# Patient Record
Sex: Female | Born: 1990 | Race: White | Hispanic: No | Marital: Married | Smoking: Never smoker
Health system: Southern US, Community
[De-identification: ages and names within clinical notes are randomized; demographics above are authoritative.]

## PROBLEM LIST (undated history)

## (undated) DIAGNOSIS — B001 Herpesviral vesicular dermatitis: Secondary | ICD-10-CM

## (undated) DIAGNOSIS — I10 Essential (primary) hypertension: Secondary | ICD-10-CM

## (undated) DIAGNOSIS — F419 Anxiety disorder, unspecified: Secondary | ICD-10-CM

## (undated) DIAGNOSIS — R519 Headache, unspecified: Secondary | ICD-10-CM

## (undated) DIAGNOSIS — R51 Headache: Secondary | ICD-10-CM

## (undated) HISTORY — PX: KELOID EXCISION: SHX1856

## (undated) HISTORY — DX: Headache, unspecified: R51.9

## (undated) HISTORY — DX: Herpesviral vesicular dermatitis: B00.1

## (undated) HISTORY — DX: Anxiety disorder, unspecified: F41.9

## (undated) HISTORY — PX: WISDOM TOOTH EXTRACTION: SHX21

## (undated) HISTORY — DX: Headache: R51

---
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
## 2011-02-02 ENCOUNTER — Encounter: Payer: Self-pay | Admitting: Women's Health

## 2011-02-02 ENCOUNTER — Other Ambulatory Visit (HOSPITAL_COMMUNITY)
Admission: RE | Admit: 2011-02-02 | Discharge: 2011-02-02 | Disposition: A | Discharge status: Home / Self Care | Payer: 59 | Source: Ambulatory Visit | Attending: Gynecology | Admitting: Gynecology

## 2011-02-02 ENCOUNTER — Ambulatory Visit (INDEPENDENT_AMBULATORY_CARE_PROVIDER_SITE_OTHER): Payer: 59 | Admitting: Women's Health

## 2011-02-02 VITALS — BP 130/84 | Ht 68.25 in | Wt 175.0 lb

## 2011-02-02 DIAGNOSIS — Z309 Encounter for contraceptive management, unspecified: Secondary | ICD-10-CM

## 2011-02-02 DIAGNOSIS — R82998 Other abnormal findings in urine: Secondary | ICD-10-CM

## 2011-02-02 DIAGNOSIS — Z01419 Encounter for gynecological examination (general) (routine) without abnormal findings: Secondary | ICD-10-CM

## 2011-02-02 DIAGNOSIS — Z23 Encounter for immunization: Secondary | ICD-10-CM

## 2011-02-02 DIAGNOSIS — Z113 Encounter for screening for infections with a predominantly sexual mode of transmission: Secondary | ICD-10-CM

## 2011-02-02 MED ORDER — VALACYCLOVIR HCL 500 MG PO TABS: 500.0000 mg | ORAL_TABLET | ORAL | Status: DC | PRN

## 2011-02-02 MED ORDER — NORETHIN-ETH ESTRAD-FE BIPHAS 1 MG-10 MCG / 10 MCG PO TABS: 1.0000 | ORAL_TABLET | Freq: Every day | ORAL | Status: DC

## 2011-02-02 NOTE — Progress Notes (Signed)
Tamara Spencer 1990/10/23 [id number]    History:    The patient presents for annual exam.  20 yo SWF G0 without complaint. Requesting contraception.Monthly, 6d cycle, currently using condoms.  Past medical history, past surgical history, family history and social history were all reviewed and documented in the EPIC chart.   ROS:  A 14 point ROS was performed and pertinent positives and negatives are included in the history.  Exam:  Filed Vitals:   02/02/11 1215  BP: 130/84    General appearance:  Normal Head/Neck:  Normal, without cervical or supraclavicular adenopathy. Thyroid:  Symmetrical, normal in size, without palpable masses or nodularity. Respiratory  Effort:  Normal  Auscultation:  Clear without wheezing or rhonchi Cardiovascular  Auscultation:  Regular rate, without rubs, murmurs or gallops  Edema/varicosities:  Not grossly evident Abdominal  Masses/tenderness:  Soft,nontender, without masses, guarding or rebound.  Liver/spleen:  No organomegaly noted  Hernia:  None appreciated  Occult test:   Skin  Inspection:  Grossly normal  Palpation:  Grossly normal Neurologic/psychiatric  Orientation:  Normal with appropriate conversation.  Mood/affect:  Normal  Genitourinary     Breasts: Examined lying and sitting.     Right: Without masses, retractions,discharge or axillary adenopathy.     Left: Without masses, retractions, discharge or axillary adenopathy.   Inguinal/mons:  Normal without inguinal adenopathy  External genitalia:  Normal  BUS/Urethra/Skene's glands:  Normal  Bladder:  Normal  Vagina:  Normal  Cervix:  Normal  Uterus:  , normal in size, shape and contour.  Midline and mobile  Adnexa/parametria:     Rt: Without masses or tenderness.   Lt: Without masses or tenderness.  Anus and perineum: Normal  Digital rectal exam: Normal sphincter tone without palpated masses or tenderness.   Assessment/Plan:  20 y.o. year oldSW female for annual exam.     she is without complaints today normal GYN exam. Of significance she does have a family history of a mother with diagnosed breast cancer at age 53.She  is estrogen receptive she is still living and doing well. She does have reservations about using birth control pills. But states did feel better when she was on. Reviewed slight risks of  Clots, strokes reviewed again the importance of SBEs, report any changes immediately.. Reviewed Mirena IUD. Handout was given and reviewed. Reviewed as slight risk of hemorrhage perforation infection was reviewed. Reviewed to have scheduled during her cycle with Dr. Audie Box. Prescription for low lo lo Estrin FE  was given. Start first day of her next cycle, encourage condoms especially first month. Will return to the office with her cycle to have IUD placed  if she so chooses. SBEs, exercise, multivitamin daily encouraged we'll check a GC and Chlamydia today UA and Pap only. She will have employment  labs drawn in the next few weeks she will have fax to our office. Declined HIV hepatitis and RPR. Gardasil information reviewed consent signed, first given today, will return in 2 and 6 months for second and third. Reviewed BRACA Testing due to family hx  Declines, states has discussed with mother.      Harrington Challenger MD, 2:18 PM 02/02/2011

## 2011-02-06 ENCOUNTER — Telehealth: Payer: Self-pay

## 2011-02-06 NOTE — Telephone Encounter (Signed)
YOU SAW PT. 02/02/11 FOR AEX. SHE WAS TO GET LABS @WORK  & HAVE THEM SENT TO YOU. THEY DID NOT DO ANY LABS. WANTS TO KNOW IF YOU NEED TO ORDER IN LABS BEFORE SHE STARTS HER OCP RX.

## 2011-02-07 ENCOUNTER — Telehealth: Payer: Self-pay | Admitting: Women's Health

## 2011-02-07 NOTE — Telephone Encounter (Signed)
NANCY DID YOU CALL PT? SHE IS STILL IN MY OPEN ENCOUNTERS FROM 02/06/11

## 2011-02-07 NOTE — Telephone Encounter (Signed)
Tc-reviewed labs for birth control pills. Will not get any labs at this time. Will have labs done at health screening at work.  Will return to office for second Gardasil.

## 2011-02-08 ENCOUNTER — Telehealth: Payer: Self-pay

## 2011-02-08 NOTE — Telephone Encounter (Signed)
ERROR NO NOTES

## 2011-02-08 NOTE — Telephone Encounter (Signed)
PER NANCY SHE SPOKE WITH PT AND ANSWERED QUESTION.

## 2011-03-13 ENCOUNTER — Ambulatory Visit: Payer: 59

## 2011-03-15 ENCOUNTER — Ambulatory Visit (INDEPENDENT_AMBULATORY_CARE_PROVIDER_SITE_OTHER): Payer: 59 | Admitting: *Deleted

## 2011-03-15 DIAGNOSIS — Z23 Encounter for immunization: Secondary | ICD-10-CM

## 2011-04-12 ENCOUNTER — Telehealth: Payer: Self-pay | Admitting: *Deleted

## 2011-04-12 NOTE — Telephone Encounter (Signed)
Left message on cell to take 2 lo Loestrin today and tomorrow. If bleeding would continue to call back, did review it sometimes takes 2 or 3 months for cycle to regulate when starting on pills.

## 2011-04-12 NOTE — Telephone Encounter (Signed)
Patient called c/o problem with oc's.  Did good with the first month pack, but started the second month pack, period started a week early.  Been going on for 9 days now.  Started new pack and still bleeding.  Not sure what to do. (was seen recently in epic chart)

## 2011-07-06 ENCOUNTER — Ambulatory Visit (INDEPENDENT_AMBULATORY_CARE_PROVIDER_SITE_OTHER): Payer: 59

## 2011-07-06 DIAGNOSIS — Z23 Encounter for immunization: Secondary | ICD-10-CM

## 2011-09-27 ENCOUNTER — Ambulatory Visit (INDEPENDENT_AMBULATORY_CARE_PROVIDER_SITE_OTHER): Payer: 59 | Admitting: Women's Health

## 2011-09-27 ENCOUNTER — Encounter: Payer: Self-pay | Admitting: Women's Health

## 2011-09-27 DIAGNOSIS — IMO0001 Reserved for inherently not codable concepts without codable children: Secondary | ICD-10-CM

## 2011-09-27 DIAGNOSIS — Z309 Encounter for contraceptive management, unspecified: Secondary | ICD-10-CM

## 2011-09-27 MED ORDER — NORGESTIMATE-ETH ESTRADIOL 0.25-35 MG-MCG PO TABS: 1.0000 | ORAL_TABLET | Freq: Every day | ORAL | Status: DC

## 2011-09-27 NOTE — Progress Notes (Signed)
Patient ID: Deltha Bernales, female   DOB: 1990-10-06, 21 y.o.   MRN: [id number] Presents with the complaint of breakthrough bleeding on lo loestrin FE. Has been on since August 2012. First 2 months minimal breakthrough bleeding, BTB  has increased each month. Same partner with negative cultures. Denies missed or late pills.  Had requested the lowest available estrogen product due to history of mother with breast cancer. Again reviewed no research linking birth control pills with breast cancer.  Breakthrough bleeding on lo Loestrin Fe  Plan: Options reviewed, IUD, progestin only pill, would like to try a different pill. Prescription, proper use for Ortho-Cyclen, will finish out current pack and then start. Instructed to call if  bleeding continues after second month. Reviewed slight risk for blood clots and strokes with combination birth control pills.

## 2011-10-01 ENCOUNTER — Institutional Professional Consult (permissible substitution): Payer: 59 | Admitting: Women's Health

## 2011-10-25 ENCOUNTER — Ambulatory Visit (INDEPENDENT_AMBULATORY_CARE_PROVIDER_SITE_OTHER): Payer: 59 | Admitting: Family Medicine

## 2011-10-25 ENCOUNTER — Encounter: Payer: Self-pay | Admitting: Family Medicine

## 2011-10-25 VITALS — BP 124/80 | HR 72 | Ht 68.0 in | Wt 175.0 lb

## 2011-10-25 DIAGNOSIS — G43909 Migraine, unspecified, not intractable, without status migrainosus: Secondary | ICD-10-CM | POA: Insufficient documentation

## 2011-10-25 DIAGNOSIS — Z0000 Encounter for general adult medical examination without abnormal findings: Secondary | ICD-10-CM

## 2011-10-25 LAB — POCT URINALYSIS DIPSTICK
Bilirubin, UA: NEGATIVE
Nitrite, UA: NEGATIVE
pH, UA: 7

## 2011-10-25 MED ORDER — ELETRIPTAN HYDROBROMIDE 40 MG PO TABS: 40.0000 mg | ORAL_TABLET | ORAL | Status: DC | PRN

## 2011-10-25 NOTE — Progress Notes (Signed)
Tamara Spencer is a 21 y.o. female who presents for a complete physical.  She has the following concerns:  New patient annual CPE, sees Maryelizabeth Rowan, NP at [facility]. Last pap 01/2011. States that she is having some HA's due to BCP's and is working with her gynecologist but did want to mention to you. UA 2+ blood, patient currently on menses.  Headaches can be triggered by skipping meals, certain fluorescent lights.  Headaches are usually in the afternoon.  The only med that helps is Excedrin Migraine, or going to sleep resolves the headaches.  Gets headaches 3-4x/week.  Perhaps slighter better since getting new contacts.  Never gets any visual changes, flashing lights, field cuts. Occasionally gets nauseated with headaches. +photo and phonophobia with headaches.  Headaches are more prominent at L forehead and L side of head, although headache is more diffuse.  Throbbing, constant. Denies sinus problems.  Having these headaches for several years (even prior to OCP's).  Headaches got worse with birth control (low dose estrogen), and even worse this month with change in OCP/higher estrogen.  Health Maintenance:  Immunization History  Administered Date(s) Administered  . HPV Quadrivalent 02/02/2011, 03/15/2011, 07/06/2011  . Td 07/09/2002  . Tdap 04/11/2009  gets flu shots yearly Last Pap smear: 01/2011 Last mammogram: never Last colonoscopy: never Last DEXA: never Dentist: twice yearly Ophtho: a few weeks ago, wears contacts Exercise: once a week Lipids--done 04/2009: total 148, LDL 89, HDL 47, TG 61 Had normal TSH, and low normal B12 (290) also in 04/2009.  Vitamin D was 28.3.  Previously took OTC Vitamin D, not taking now, other than MVI Chem panel 11/2009 normal except nonfasting glu 101  ROS:  The patient denies anorexia, fever, weight changes,  vision changes, decreased hearing, ear pain, sore throat, breast concerns, chest pain, palpitations, dizziness, syncope, dyspnea on exertion,  cough, swelling, nausea, vomiting, diarrhea, constipation, abdominal pain, melena, hematochezia, indigestion/heartburn, hematuria, incontinence, dysuria, vaginal discharge, odor or itch, genital lesions, joint pains, numbness, tingling, weakness, tremor, suspicious skin lesions, depression, anxiety, abnormal bleeding/bruising (always bruises easily), or enlarged lymph nodes.  Periods were irregular on lo-loestrin FE (bleeding/spotting frequently), so recently changed OCP's to higher estrogen.  No abnormal bleeding this month, but having more headaches.  PHYSICAL EXAM: BP 124/80  Pulse 72  Ht 5\' 8"  (1.727 m)  Wt 175 lb (79.379 kg)  BMI 26.61 kg/m2  LMP 10/22/2011 124/80 on repeat by MD General Appearance:    Alert, cooperative, no distress, appears stated age  Head:    Normocephalic, without obvious abnormality, atraumatic  Eyes:    PERRL, conjunctiva/corneas clear, EOM's intact, fundi    benign  Ears:    Normal TM's and external ear canals  Nose:   Nares normal, mucosa normal, no drainage or sinus   tenderness  Throat:   Lips, mucosa, and tongue normal; teeth and gums normal  Neck:   Supple, no lymphadenopathy;  thyroid:  no   enlargement/tenderness/nodules; no carotid   bruit or JVD  Back:    Spine nontender, no curvature, ROM normal, no CVA     tenderness  Lungs:     Clear to auscultation bilaterally without wheezes, rales or     ronchi; respirations unlabored  Chest Wall:    No tenderness or deformity   Heart:    Regular rate and rhythm, S1 and S2 normal, no murmur, rub   or gallop  Breast Exam:   deferred to GYN  Abdomen:     Soft, non-tender,  nondistended, normoactive bowel sounds,    no masses, no hepatosplenomegaly  Genitalia:   deferred to GYN  Rectal:    Not performed due to age<40 and no related complaints  Extremities:   No clubbing, cyanosis or edema  Pulses:   2+ and symmetric all extremities  Skin:   Skin color, texture, turgor normal, no rashes or lesions  Lymph  nodes:   Cervical, supraclavicular, and axillary nodes normal  Neurologic:   CNII-XII intact, normal strength, sensation and gait; reflexes 2+ and symmetric throughout          Psych:   Normal mood, affect, hygiene and grooming.    ASSESSMENT/PLAN 1. Routine general medical examination at a health care facility  POCT Urinalysis Dipstick, Visual acuity screening  2. Migraine headache  eletriptan (RELPAX) 40 MG tablet   Headaches--sound like migraines.  Reviewed possible triggers; keep headache log.  To discuss with GYN whether or not she wants to consider other forms of contraception.  If ongoing headaches, despite trying to avoid triggers, return in 1-2 months to discuss preventative meds.  Sample (and copay card) for Relpax given. Discussed potential risks/side effects, and to not use too frequently or will lose efficacy. Discussed using OTC migraine med vs NSAID + caffeine at early onset of headache, then proceeding to triptan in 1-2 hours if not better.  Declines vitamin D testing.  Not fasting today.  No other labs needed. Encouraged 1000 IU daily.  Discussed monthly self breast exams and breast cancer screening at age 66 given family history; at least 30 minutes of aerobic activity at least 5 days/week; proper sunscreen use reviewed; healthy diet, including goals of calcium and vitamin D intake and alcohol recommendations (less than or equal to 1 drink/day) reviewed; regular seatbelt use; changing batteries in smoke detectors.  Immunization recommendations discussed--UTD.

## 2011-10-25 NOTE — Patient Instructions (Addendum)
HEALTH MAINTENANCE RECOMMENDATIONS:  It is recommended that you get at least 30 minutes of aerobic exercise at least 5 days/week (for weight loss, you may need as much as 60-90 minutes). This can be any activity that gets your heart rate up. This can be divided in 10-15 minute intervals if needed, but try and build up your endurance at least once a week.  Weight bearing exercise is also recommended twice weekly.  Eat a healthy diet with lots of vegetables, fruits and fiber.  "Colorful" foods have a lot of vitamins (ie green vegetables, tomatoes, red peppers, etc).  Limit sweet tea, regular sodas and alcoholic beverages, all of which has a lot of calories and sugar.  Up to 1 alcoholic drink daily may be beneficial for women (unless trying to lose weight, watch sugars).  Drink a lot of water.  Calcium recommendations are 1200-1500 mg daily (1500 mg for postmenopausal women or women without ovaries), and vitamin D 1000 IU daily.  This should be obtained from diet and/or supplements (vitamins), and calcium should not be taken all at once, but in divided doses.  Monthly self breast exams and yearly mammograms for women over the age of 27 is recommended.  Sunscreen of at least SPF 30 should be used on all sun-exposed parts of the skin when outside between the hours of 10 am and 4 pm (not just when at beach or pool, but even with exercise, golf, tennis, and yard work!)  Use a sunscreen that says "broad spectrum" so it covers both UVA and UVB rays, and make sure to reapply every 1-2 hours.  Remember to change the batteries in your smoke detectors when changing your clock times in the spring and fall.  Use your seat belt every time you are in a car, and please drive safely and not be distracted with cell phones and texting while driving.   Migraine Headache A migraine headache is an intense, throbbing pain on one or both sides of your head. The exact cause of a migraine headache is not always known. A  migraine may be caused when nerves in the brain become irritated and release chemicals that cause swelling within blood vessels, causing pain. Many migraine sufferers have a family history of migraines. Before you get a migraine you may or may not get an aura. An aura is a group of symptoms that can predict the beginning of a migraine. An aura may include:  Visual changes such as:   Flashing lights.   Bright spots or zig-zag lines.   Tunnel vision.   Feelings of numbness.   Trouble talking.   Muscle weakness.  SYMPTOMS  Pain on one or both sides of your head.   Pain that is pulsating or throbbing in nature.   Pain that is severe enough to prevent daily activities.   Pain that is aggravated by any daily physical activity.   Nausea (feeling sick to your stomach), vomiting, or both.   Pain with exposure to bright lights, loud noises, or activity.   General sensitivity to bright lights or loud noises.  MIGRAINE TRIGGERS Examples of triggers of migraine headaches include:   Alcohol.   Smoking.   Stress.   It may be related to menses (female menstruation).   Aged cheeses.   Foods or drinks that contain nitrates, glutamate, aspartame, or tyramine.   Lack of sleep.   Chocolate.   Caffeine.   Hunger.   Medications such as nitroglycerine (used to treat chest pain), birth control  pills, estrogen, and some blood pressure medications.  DIAGNOSIS  A migraine headache is often diagnosed based on:  Symptoms.   Physical examination.   A computerized X-ray scan (computed tomography, CT) of your head.  TREATMENT  Medications can help prevent migraines if they are recurrent or should they become recurrent. Your caregiver can help you with a medication or treatment program that will be helpful to you.   Lying down in a dark, quiet room may be helpful.   Keeping a headache diary may help you find a trend as to what may be triggering your headaches.  SEEK IMMEDIATE  MEDICAL CARE IF:   You have confusion, personality changes or seizures.   You have headaches that wake you from sleep.   You have an increased frequency in your headaches.   You have a stiff neck.   You have a loss of vision.   You have muscle weakness.   You start losing your balance or have trouble walking.   You feel faint or pass out.  MAKE SURE YOU:   Understand these instructions.   Will watch your condition.   Will get help right away if you are not doing well or get worse.  Document Released: 06/25/2005 Document Revised: 06/14/2011 Document Reviewed: 02/08/2009 [facility] Patient Information 2012 [place], [place].  Try and keep a headache journal as we discussed (writing down frequency and severity, possible triggers, what you took and whether or not it worked).  At early onset of headache, try 600-800mg  of ibuprofen or 2 Aleve, along with caffeine, vs an OTC "migraine" medication.   If headache persists (or is worse), then try the Relpax. You can try 1/2 to 1 tablet, and if no better, may repeat in 2 hours.  Maximum of 80mg  in 24 hours.  You shouldn't take this too frequently, or else it will stop working.  Return in 1 month to discuss headaches, if still occuring 3-4 times weekly.

## 2011-11-09 ENCOUNTER — Encounter (HOSPITAL_COMMUNITY): Payer: Self-pay | Admitting: Psychiatry

## 2011-11-09 ENCOUNTER — Ambulatory Visit (INDEPENDENT_AMBULATORY_CARE_PROVIDER_SITE_OTHER): Payer: 59 | Admitting: Psychiatry

## 2011-11-09 VITALS — BP 150/80 | HR 60 | Resp 12

## 2011-11-09 DIAGNOSIS — F4323 Adjustment disorder with mixed anxiety and depressed mood: Secondary | ICD-10-CM

## 2011-11-09 MED ORDER — CLORAZEPATE DIPOTASSIUM 3.75 MG PO TABS: ORAL_TABLET | ORAL | Status: DC

## 2011-11-09 NOTE — Progress Notes (Deleted)
Psychiatric Assessment Adult  Patient Identification:  Tamara Spencer Date of Evaluation:  11/09/2011 Chief Complaint: *** History of Chief Complaint:   Chief Complaint  Patient presents with  . Anxiety    HPI Review of Systems Physical Exam  Depressive Symptoms: {DEPRESSION SYMPTOMS:20000}  (Hypo) Manic Symptoms:   Elevated Mood:  {BHH YES OR NO:22294} Irritable Mood:  {BHH YES OR NO:22294} Grandiosity:  {BHH YES OR NO:22294} Distractibility:  {BHH YES OR NO:22294} Labiality of Mood:  {BHH YES OR NO:22294} Delusions:  {BHH YES OR NO:22294} Hallucinations:  {BHH YES OR NO:22294} Impulsivity:  {BHH YES OR NO:22294} Sexually Inappropriate Behavior:  {BHH YES OR NO:22294} Financial Extravagance:  {BHH YES OR NO:22294} Flight of Ideas:  {BHH YES OR NO:22294}  Anxiety Symptoms: Excessive Worry:  {BHH YES OR NO:22294} Panic Symptoms:  {BHH YES OR NO:22294} Agoraphobia:  {BHH YES OR NO:22294} Obsessive Compulsive: {BHH YES OR NO:22294}  Symptoms: {Obsessive Compulsive Symptoms:22671} Specific Phobias:  {BHH YES OR NO:22294} Social Anxiety:  {BHH YES OR NO:22294}  Psychotic Symptoms:  Hallucinations: {BHH YES OR NO:22294} {Hallucinations:22672} Delusions:  {BHH YES OR NO:22294} Paranoia:  {BHH YES OR NO:22294}   Ideas of Reference:  {BHH YES OR NO:22294}  PTSD Symptoms: Ever had a traumatic exposure:  {BHH YES OR NO:22294} Had a traumatic exposure in the last month:  {BHH YES OR NO:22294} Re-experiencing: {BHH YES OR NO:22294} {Re-experiencing:22673} Hypervigilance:  {BHH YES OR NO:22294} Hyperarousal: {BHH YES OR NO:22294} {Hyperarousal:22674} Avoidance: {BHH YES OR NO:22294} {Avoidance:22675}  Traumatic Brain Injury: {BHH YES OR NO:22294} {Traumatic Brain Injury:22676}  Past Psychiatric History: Diagnosis: ***  Hospitalizations: ***  Outpatient Care: ***  Substance Abuse Care: ***  Self-Mutilation: ***  Suicidal Attempts: ***  Violent Behaviors: ***   Past  Medical History:   Past Medical History  Diagnosis Date  . Recurrent cold sores   . Menorrhagia    History of Loss of Consciousness:  {BHH YES OR NO:22294} Seizure History:  {BHH YES OR NO:22294} Cardiac History:  {BHH YES OR NO:22294} Allergies:  No Known Allergies Current Medications:  Current Outpatient Prescriptions  Medication Sig Dispense Refill  . eletriptan (RELPAX) 40 MG tablet One tablet by mouth at onset of headache. May repeat in 2 hours if headache persists or recurs. may repeat in 2 hours if necessary  2 tablet  0  . Multiple Vitamins-Minerals (MULTIVITAMIN WITH MINERALS) tablet Take 1 tablet by mouth daily.      . norgestimate-ethinyl estradiol (SPRINTEC 28) 0.25-35 MG-MCG tablet Take 1 tablet by mouth daily.  3 Package  3  . valACYclovir (VALTREX) 500 MG tablet Take 1 tablet (500 mg total) by mouth as needed.  30 tablet  12    Previous Psychotropic Medications:  Medication Dose   ***  ***                     Substance Abuse History in the last 12 months: Substance Age of 1st Use Last Use Amount Specific Type  Nicotine  ***  ***  ***  ***  Alcohol  ***  ***  ***  ***  Cannabis  ***  ***  ***  ***  Opiates  ***  ***  ***  ***  Cocaine  ***  ***  ***  ***  Methamphetamines  ***  ***  ***  ***  LSD  ***  ***  ***  ***  Ecstasy  ***   ***  ***  ***  Benzodiazepines  ***  ***  ***  ***  Caffeine  ***  ***  ***  ***  Inhalants  ***  ***  ***  ***  Others:                          Medical Consequences of Substance Abuse: ***  Legal Consequences of Substance Abuse: ***  Family Consequences of Substance Abuse: ***  Blackouts:  {BHH YES OR NO:22294} DT's:  {BHH YES OR NO:22294} Withdrawal Symptoms:  {BHH YES OR NO:22294} {Withdrawal Symptoms:22677}  Social History: Current Place of Residence: *** Place of Birth: *** Family Members: *** Marital Status:  {Marital Status:22678} Children: ***  Sons: ***  Daughters: *** Relationships: *** Education:   {Education:22679} Educational Problems/Performance: *** Religious Beliefs/Practices: *** History of Abuse: {Desc; abuse:16542} Occupational Experiences; Military History:  {Military History:22680} Legal History: *** Hobbies/Interests: ***  Family History:   Family History  Problem Relation Age of Onset  . Diabetes Father   . Hypertension Father   . Cancer Maternal Aunt     great aunt --colon (late 13's)  . Diabetes Paternal Grandfather   . Cancer Mother 59    estrogen receptor + breast cancer  . Cancer Maternal Aunt     great aunt--breast cancer (40's)    Mental Status Examination/Evaluation: Objective:  Appearance: {Appearance:22683}  Eye Contact::  {BHH EYE CONTACT:22684}  Speech:  {Speech:22685}  Volume:  {Volume (PAA):22686}  Mood:  ***  Affect:  {Affect (PAA):22687}  Thought Process:  {Thought Process (PAA):22688}  Orientation:  {BHH ORIENTATION (PAA):22689}  Thought Content:  {Thought Content:22690}  Suicidal Thoughts:  {ST/HT (PAA):22692}  Homicidal Thoughts:  {ST/HT (PAA):22692}  Judgement:  {Judgement (PAA):22694}  Insight:  {Insight (PAA):22695}  Psychomotor Activity:  {Psychomotor (PAA):22696}  Akathisia:  {BHH YES OR NO:22294}  Handed:  {Handed:22697}  AIMS (if indicated):  ***  Assets:  {Assets (PAA):22698}    Laboratory/X-Ray Psychological Evaluation(s)   ***  ***   Assessment:  {axis diagnosis:3049000}  AXIS I {psych axis 1:31909}  AXIS II {psych axis 2:31910}  AXIS III Past Medical History  Diagnosis Date  . Recurrent cold sores   . Menorrhagia      AXIS IV {psych axis iv:31915}  AXIS V {psych axis v score:31919}   Treatment Plan/Recommendations:  Plan of Care: ***  Laboratory:  {Laboratory:22682}  Psychotherapy: ***  Medications: ***  Routine PRN Medications:  {BHH YES OR NO:22294}  Consultations: ***  Safety Concerns:  ***  Other:      Lucas Mallow, MD 5/3/201311:02 AM
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
## 2011-11-12 ENCOUNTER — Telehealth: Payer: Self-pay | Admitting: *Deleted

## 2011-11-12 NOTE — Telephone Encounter (Signed)
Patient's boyfriend called because he was concerned that pt had "passed out" 4 times in the last 30 minutes. When he wakes her up she seems really out of it. What should he do?

## 2011-11-12 NOTE — Telephone Encounter (Signed)
If she is having recurrent syncopal episodes, she needs to be evaluated in the ER

## 2011-11-12 NOTE — Telephone Encounter (Signed)
Spoke with Tammy Sours and let him know that Dr.Knapp advised that she be seen at the ER.

## 2011-11-15 ENCOUNTER — Encounter (HOSPITAL_COMMUNITY): Payer: Self-pay | Admitting: Psychology

## 2011-11-15 ENCOUNTER — Ambulatory Visit (INDEPENDENT_AMBULATORY_CARE_PROVIDER_SITE_OTHER): Payer: 59 | Admitting: Psychology

## 2011-11-15 DIAGNOSIS — F419 Anxiety disorder, unspecified: Secondary | ICD-10-CM

## 2011-11-15 DIAGNOSIS — F329 Major depressive disorder, single episode, unspecified: Secondary | ICD-10-CM

## 2011-11-15 DIAGNOSIS — F341 Dysthymic disorder: Secondary | ICD-10-CM

## 2011-11-15 DIAGNOSIS — F4323 Adjustment disorder with mixed anxiety and depressed mood: Secondary | ICD-10-CM

## 2011-11-15 NOTE — Progress Notes (Signed)
   THERAPIST PROGRESS NOTE  Session Time: 1030 - 1115  Participation Level: Active  Behavioral Response: Neat and Well GroomedAlertAnxious  Type of Therapy: Individual Therapy  Treatment Goals addressed: Anxiety and Coping  Interventions: Psychosocial Skills: variety of ways to deal with anxiety  Summary: Tamara Spencer is a 21 y.o. female who presents with complaint of anxiety that is leading her to think about self-mutilation, something she practiced as a teen and does not want to do again. "It was so hard to stop")  We reviewed the main elements of her recent history and the concerns she is currently having regarding relationship.  We started identifying thought that trigger the impulse to cut:  When her mother is so tired and cries about the fall-off of income in the family business; when her father shares his concerns about the business' or when she sees her current female friend talking to another woman. Significant history:  She dated only one boy from age 79 until 3.  At age 22, he had a ring and asked her to marry him, but wanted to ask her father first, and never quite got around to doing that.  Later he said, he wasn't "ready yet for marriage".  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  After she broke up with him, he and his brother left the business, and the replacements her father has hired are not as dedicated or productive, so the business is in trouble and her father and mother are both having to put in more hours themselves.  Because of the isolation with the boyfriend, she did not develop her own friends or interests, and so has very little to fall back on now.  We talked about the goals for therapy, to begin to find out who Austen is as a person in her own right.  We talked about the value of exercise in the treatment of anxiety and depression and brainstormed about other activities she might try to see what it is she likes. She was most  receptive and able to see the value to exploring to learn more about herself.  Suicidal/Homicidal: Nowithout intent/plan  Therapist Response: Helped her outline some goals for therapy and a plan for sessions every 2 weeks for the next three months.  Did inform her of my pending retirement, and assured her of continuation with another therapist , if she still needs this then.  Plan: Return again in 2 weeks.  In the meantime she has agreed to : 1. Start a journal to write details of occasions when she feels like cutting and all that was going on before she started feeling that way. 2. Offer to share some household expenses at her parent's home, because she is now an adult and needs to learn about being more independent financially. 3. Investigate training opportunities for her "mean" dog so that he will be a better companion. 4. Talk to more people at work and find out about their hobbies and interests.  Try out some leisure activities to start to learn what she likes. 5. Use her piano skills as something active to do with her hands when she feels like cutting.  Play to match her mood or to uplift herself.   Diagnosis: Axis I: Adjustment Disorder with Mixed Emotional Features    Axis II: Deferred    Rayvion Stumph, RN 11/15/2011

## 2011-12-04 ENCOUNTER — Ambulatory Visit (HOSPITAL_COMMUNITY): Payer: Self-pay | Admitting: Psychology

## 2011-12-19 ENCOUNTER — Ambulatory Visit (HOSPITAL_COMMUNITY): Payer: Self-pay | Admitting: Psychiatry

## 2012-02-08 ENCOUNTER — Ambulatory Visit (INDEPENDENT_AMBULATORY_CARE_PROVIDER_SITE_OTHER): Payer: 59 | Admitting: Medical

## 2012-02-08 ENCOUNTER — Encounter: Payer: Self-pay | Admitting: Medical

## 2012-02-08 VITALS — BP 128/80 | HR 64 | Temp 98.3°F | Resp 16 | Wt 178.0 lb

## 2012-02-08 DIAGNOSIS — H00019 Hordeolum externum unspecified eye, unspecified eyelid: Secondary | ICD-10-CM

## 2012-02-08 DIAGNOSIS — W57XXXA Bitten or stung by nonvenomous insect and other nonvenomous arthropods, initial encounter: Secondary | ICD-10-CM

## 2012-02-08 DIAGNOSIS — R110 Nausea: Secondary | ICD-10-CM

## 2012-02-08 DIAGNOSIS — T148XXA Other injury of unspecified body region, initial encounter: Secondary | ICD-10-CM

## 2012-02-08 DIAGNOSIS — R509 Fever, unspecified: Secondary | ICD-10-CM

## 2012-02-08 DIAGNOSIS — T148 Other injury of unspecified body region: Secondary | ICD-10-CM

## 2012-02-08 MED ORDER — DOXYCYCLINE HYCLATE 100 MG PO TABS: 100.0000 mg | ORAL_TABLET | Freq: Two times a day (BID) | ORAL | Status: AC

## 2012-02-08 NOTE — Progress Notes (Signed)
  Subjective:   HPI  Tamara Spencer is a 21 y.o. female who presents with few day hx/o nausea, fatigue and insect bite.  Otherwise no symptoms.  Was out in the woods hiking and fishing over the past weekend, got bitten on legs and abdomen by bugs, not sure what bugs though. She did have ticks on her but none adhered or bit in.  No other aggravating or relieving factors.  She also notes small stye of left eye , improving some with OTC eye drops.    No other c/o.  The following portions of the patient's history were reviewed and updated as appropriate: allergies, current medications, past family history, past medical history, past social history, past surgical history and problem list.  Past Medical History  Diagnosis Date  . Recurrent cold sores   . Menorrhagia   . History of eating disorder   . Anxiety     No Known Allergies   Review of Systems ROS reviewed and was negative other than noted in HPI or above.    Objective:   Physical Exam  General appearance: alert, no distress, WD/WN Skin: left anterior lower leg just distal to the knee with 3cm x 2cm patch of raised erythema and central marking suggestive of insect bite, similar but slightly larger erythematous lesion behind left knee, and similar sized and character lesion on right lower abdomen, all suggestive of insect bite such as chigger or tick bites HEENT: left lower eyelid medially with round swelling c/w stye, normocephalic, sclerae anicteric, TMs pearly, nares patent, no discharge or erythema, pharynx normal Oral cavity: MMM, no lesions Neck: supple, no lymphadenopathy, no thyromegaly, no masses Heart: RRR, normal S1, S2, no murmurs Lungs: CTA bilaterally, no wheezes, rhonchi, or rales Pulses: 2+ symmetric   Assessment and Plan :     Encounter Diagnoses  Name Primary?  . Insect bite Yes  . Nausea   . Fever   . Stye    Advised rest, hydrate well, begin Doxycycline for possible insect or tick born illness, and  can use Cortaid OTC topically on the bites.  Call/return if worse or not improving, discussed caution in the sun with Doxycyline.  Stye - warm compresses, and if not improving or worse in the next 5-7 days, then call.

## 2012-04-04 ENCOUNTER — Other Ambulatory Visit: Payer: Self-pay | Admitting: Women's Health

## 2012-04-04 MED ORDER — VALACYCLOVIR HCL 500 MG PO TABS: 500.0000 mg | ORAL_TABLET | ORAL | Status: DC | PRN

## 2012-04-09 ENCOUNTER — Encounter: Payer: 59 | Admitting: Women's Health

## 2012-04-25 ENCOUNTER — Encounter: Payer: Self-pay | Admitting: Gynecology

## 2012-04-25 ENCOUNTER — Ambulatory Visit (INDEPENDENT_AMBULATORY_CARE_PROVIDER_SITE_OTHER): Payer: 59 | Admitting: Gynecology

## 2012-04-25 VITALS — BP 116/74 | Ht 69.0 in | Wt 183.0 lb

## 2012-04-25 DIAGNOSIS — Z01419 Encounter for gynecological examination (general) (routine) without abnormal findings: Secondary | ICD-10-CM

## 2012-04-25 DIAGNOSIS — IMO0001 Reserved for inherently not codable concepts without codable children: Secondary | ICD-10-CM

## 2012-04-25 DIAGNOSIS — L293 Anogenital pruritus, unspecified: Secondary | ICD-10-CM

## 2012-04-25 DIAGNOSIS — N898 Other specified noninflammatory disorders of vagina: Secondary | ICD-10-CM

## 2012-04-25 DIAGNOSIS — Z309 Encounter for contraceptive management, unspecified: Secondary | ICD-10-CM

## 2012-04-25 LAB — CBC WITH DIFFERENTIAL/PLATELET
Basophils Relative: 0 % (ref 0–1)
Eosinophils Absolute: 0 10*3/uL (ref 0.0–0.7)
Eosinophils Relative: 0 % (ref 0–5)
HCT: 38.4 % (ref 36.0–46.0)
Hemoglobin: 13.1 g/dL (ref 12.0–15.0)
MCH: 27.8 pg (ref 26.0–34.0)
MCHC: 34.1 g/dL (ref 30.0–36.0)
Monocytes Absolute: 0.5 10*3/uL (ref 0.1–1.0)
Monocytes Relative: 6 % (ref 3–12)
Neutrophils Relative %: 66 % (ref 43–77)

## 2012-04-25 MED ORDER — NORGESTIMATE-ETH ESTRADIOL 0.25-35 MG-MCG PO TABS: 1.0000 | ORAL_TABLET | Freq: Every day | ORAL | Status: DC

## 2012-04-25 MED ORDER — FLUCONAZOLE 150 MG PO TABS: 150.0000 mg | ORAL_TABLET | Freq: Once | ORAL | Status: DC

## 2012-04-25 NOTE — Patient Instructions (Signed)
Follow up in one year for annual gynecologic exam. 

## 2012-04-25 NOTE — Progress Notes (Signed)
Tamara Spencer 05-16-1991 [id number]        21 y.o.  G0P0 for annual exam.  Several issues noted below.  Past medical history,surgical history, medications, allergies, family history and social history were all reviewed and documented in the EPIC chart. ROS:  Was performed and pertinent positives and negatives are included in the history.  Exam: Kim assistant Filed Vitals:   04/25/12 1211  BP: 116/74  Height: 5\' 9"  (1.753 m)  Weight: 183 lb (83.008 kg)   General appearance  Normal Skin grossly normal Head/Neck normal with no cervical or supraclavicular adenopathy thyroid normal Lungs  clear Cardiac RR, without RMG Abdominal  soft, nontender, without masses, organomegaly or hernia Breasts  examined lying and sitting without masses, retractions, discharge or axillary adenopathy. Pelvic  Ext/BUS/vagina  normal slight white discharge  Cervix  normal   Uterus  anteverted, normal size, shape and contour, midline and mobile nontender   Adnexa  Without masses or tenderness    Anus and perineum  normal      Assessment/Plan:  21 y.o. G0P0 female for annual exam.   1. Contraceptive management. Patient had questions about contraceptive options. Currently is on oral contraceptives doing well. Her boyfriend is afraid she will forget taking them. She does not do have a problem remembering. I did review options to include pill patch ring Implanon Depo-Provera IUD. Patient is comfortable with continuing on the birth control pills and I refilled her times a year. 2. Headaches. Patient's getting headaches several times weekly.  Not classic migraines. Not linked to her menses. Recommend follow up with neurologist and I gave her the name of Dr. Melvyn Neth she agrees to call and arrange. 3. Pap smear. No Pap smear done today. Last Pap smear 2012 which is normal. Plan every 3 years screening per current screening guidelines. 4. Breast health. Patient's mother developed breast cancer at age 47. Her mother's  maternal aunt also had breast cancer. Strongly recommended that her mother undergo genetic testing. Apparently her physician has discussed this with her and I urged the patient to discuss with her mother to be tested. Possible increased risk of breast cancer/ovarian cancer reviewed. Benefits of oral contraceptives from a decreased ovarian cancer standpoint discussed. We'll recommend mammogram as she gets further into her 49s. SBE monthly discussed. 5. Gardasil. Patient has received a series. 6. STD screening offered and declined. 7. Health maintenance. Baseline CBC urinalysis ordered. No overt risk factors or family history for glucose lipid screening at this time. Follow up one year, sooner as needed.    Dara Lords MD, 12:39 PM 04/25/2012

## 2012-04-26 LAB — URINALYSIS W MICROSCOPIC + REFLEX CULTURE
Bilirubin Urine: NEGATIVE
Hgb urine dipstick: NEGATIVE
Ketones, ur: NEGATIVE mg/dL
Protein, ur: NEGATIVE mg/dL
Urobilinogen, UA: 1 mg/dL (ref 0.0–1.0)

## 2012-04-29 LAB — URINE CULTURE

## 2012-05-06 ENCOUNTER — Telehealth: Payer: Self-pay | Admitting: Gynecology

## 2012-05-06 MED ORDER — AMPICILLIN 500 MG PO CAPS: 500.0000 mg | ORAL_CAPSULE | Freq: Four times a day (QID) | ORAL | Status: DC

## 2012-05-06 NOTE — Telephone Encounter (Signed)
Dr. Velvet Bathe-  Did you want patient to follow-up with TOC urine culture after meds?

## 2012-05-06 NOTE — Telephone Encounter (Signed)
Tell patient urinalysis grew out bacteria and I want to treat her with ampicillin 500 mg 4 times a day x7 days. Backup contraception.

## 2012-05-06 NOTE — Telephone Encounter (Signed)
Patient informed. Rx e-scribed. 

## 2012-05-06 NOTE — Telephone Encounter (Signed)
Left message for patient to call.

## 2012-05-07 NOTE — Telephone Encounter (Signed)
Never unless I indicate in note

## 2012-05-07 NOTE — Telephone Encounter (Signed)
Patient advised no need to return for TOC urine.

## 2012-05-08 ENCOUNTER — Encounter: Payer: Self-pay | Admitting: Gynecology

## 2012-06-17 ENCOUNTER — Other Ambulatory Visit: Payer: Self-pay | Admitting: Women's Health

## 2012-06-17 DIAGNOSIS — B009 Herpesviral infection, unspecified: Secondary | ICD-10-CM

## 2012-06-18 ENCOUNTER — Other Ambulatory Visit: Payer: Self-pay | Admitting: Women's Health

## 2012-06-18 DIAGNOSIS — B009 Herpesviral infection, unspecified: Secondary | ICD-10-CM

## 2012-06-18 MED ORDER — VALACYCLOVIR HCL 500 MG PO TABS: 500.0000 mg | ORAL_TABLET | Freq: Two times a day (BID) | ORAL | Status: DC

## 2012-06-18 MED ORDER — VALACYCLOVIR HCL 500 MG PO TABS: 500.0000 mg | ORAL_TABLET | ORAL | Status: DC | PRN

## 2012-06-18 NOTE — Telephone Encounter (Signed)
Please send over Rx with 12 refills. Valtrex 500 twice a day 3-5 days when necessary #30 with 12 refills.

## 2012-08-23 ENCOUNTER — Other Ambulatory Visit: Payer: Self-pay

## 2012-09-01 ENCOUNTER — Other Ambulatory Visit: Payer: Self-pay | Admitting: *Deleted

## 2012-09-01 ENCOUNTER — Encounter: Payer: Self-pay | Admitting: Family Medicine

## 2012-09-01 ENCOUNTER — Encounter: Payer: Self-pay | Admitting: Gynecology

## 2012-09-01 NOTE — Telephone Encounter (Signed)
I doubt that it is related to the birth control pills. I would start with your primary. Wearing a good sunscreen helps. Ultimately we can try switching you to a different birth control pill to see if this doesn't eliminate the rash.

## 2012-10-10 ENCOUNTER — Encounter: Payer: Self-pay | Admitting: Family Medicine

## 2012-10-10 ENCOUNTER — Ambulatory Visit (INDEPENDENT_AMBULATORY_CARE_PROVIDER_SITE_OTHER): Payer: 59 | Admitting: Family Medicine

## 2012-10-10 VITALS — BP 110/74 | HR 73 | Wt 196.0 lb

## 2012-10-10 DIAGNOSIS — M79672 Pain in left foot: Secondary | ICD-10-CM

## 2012-10-10 DIAGNOSIS — M79609 Pain in unspecified limb: Secondary | ICD-10-CM

## 2012-10-10 NOTE — Progress Notes (Signed)
  Subjective:    Patient ID: Tamara Spencer, female    DOB: 1991/04/09, 22 y.o.   MRN: [id number]  HPI 10 days ago she noted swelling and discomfort in the left foot proximally. She notes pain especially with plantar and dorsiflexion. No history of recent or past history of trauma. No other joints are involved. She did try ice and did do 400 mg of ibuprofen twice a day for several days the   Review of Systems     Objective:   Physical Exam Exam of the left foot shows questionable edema. Good motion of the ankle and foot. No tenderness to palpation. No laxity noted.       Assessment & Plan:  Left foot pain I will recommend ibuprofen 800 mg 3 times a day as well as heat 20 minutes 3 times per day. If this takes care of her symptoms, no further evaluation is necessary. If she continues had difficulty, further evaluation including x-rays will be done.

## 2012-10-10 NOTE — Patient Instructions (Signed)
4 ibuprofen 3 times per day and you can also use heat for 20 minutes 3 times per day for the next 7-10 days. If it goes away then we are home free ;if it doesn't go away then we will get x-rays and anything else we need to do

## 2012-10-20 ENCOUNTER — Encounter: Payer: Self-pay | Admitting: Gynecology

## 2012-10-20 NOTE — Telephone Encounter (Signed)
Dr. Velvet Bathe- Patient has taken two packs of active pills without placebo break.  BTB began on first week of 2nd pack and continues now in 3rd week. What to recommend?

## 2012-11-12 ENCOUNTER — Ambulatory Visit
Admission: RE | Admit: 2012-11-12 | Discharge: 2012-11-12 | Disposition: A | Discharge status: Home / Self Care | Payer: 59 | Source: Ambulatory Visit | Attending: Family Medicine | Admitting: Family Medicine

## 2012-11-12 ENCOUNTER — Ambulatory Visit (INDEPENDENT_AMBULATORY_CARE_PROVIDER_SITE_OTHER): Payer: 59 | Admitting: Family Medicine

## 2012-11-12 ENCOUNTER — Encounter: Payer: Self-pay | Admitting: Family Medicine

## 2012-11-12 VITALS — BP 130/76 | HR 80 | Ht 68.0 in | Wt 197.0 lb

## 2012-11-12 DIAGNOSIS — M25572 Pain in left ankle and joints of left foot: Secondary | ICD-10-CM

## 2012-11-12 DIAGNOSIS — B009 Herpesviral infection, unspecified: Secondary | ICD-10-CM

## 2012-11-12 DIAGNOSIS — B001 Herpesviral vesicular dermatitis: Secondary | ICD-10-CM | POA: Insufficient documentation

## 2012-11-12 DIAGNOSIS — M79672 Pain in left foot: Secondary | ICD-10-CM

## 2012-11-12 DIAGNOSIS — M79609 Pain in unspecified limb: Secondary | ICD-10-CM

## 2012-11-12 DIAGNOSIS — M25579 Pain in unspecified ankle and joints of unspecified foot: Secondary | ICD-10-CM
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
## 2012-11-12 NOTE — Patient Instructions (Addendum)
For cold sores you can take Valtrex in the following manner--take 4 tablets all together (2 grams) at onset of cold sore, and then repeat the dose 12 hours later.  And that's it.  Get x-rays done at your convenience.  You may increase ibuprofen to 800 mg three times daily with food--cut back on the dose if it bothers your stomach.  You may use this dose for up to 2 weeks.  If you aren't getting complete resolution of your pain, call us for referral.

## 2013-01-21 ENCOUNTER — Encounter: Payer: Self-pay | Admitting: Family Medicine

## 2013-04-15 ENCOUNTER — Encounter: Payer: Self-pay | Admitting: Medical

## 2013-04-15 ENCOUNTER — Ambulatory Visit (INDEPENDENT_AMBULATORY_CARE_PROVIDER_SITE_OTHER): Payer: 59 | Admitting: Medical

## 2013-04-15 VITALS — BP 110/70 | HR 78 | Temp 98.0°F | Resp 16 | Wt 203.0 lb

## 2013-04-15 DIAGNOSIS — B354 Tinea corporis: Secondary | ICD-10-CM

## 2013-04-15 DIAGNOSIS — R109 Unspecified abdominal pain: Secondary | ICD-10-CM

## 2013-04-15 DIAGNOSIS — J029 Acute pharyngitis, unspecified: Secondary | ICD-10-CM

## 2013-04-15 DIAGNOSIS — R195 Other fecal abnormalities: Secondary | ICD-10-CM

## 2013-04-15 LAB — POCT URINALYSIS DIPSTICK
Bilirubin, UA: NEGATIVE
Ketones, UA: NEGATIVE
Leukocytes, UA: NEGATIVE
Protein, UA: NEGATIVE
Spec Grav, UA: <=1.005
pH, UA: 5

## 2013-04-15 NOTE — Addendum Note (Signed)
Addended by: Jac Canavan on: 04/15/2013 02:11 PM   Modules accepted: Orders

## 2013-04-15 NOTE — Progress Notes (Signed)
Subjective: Here for sore throat since Sunday x 4 days.  Thought it was allergies, using some antihistamine.   Worse on left.  Has had some itchy watery eyes, but just seems drier than normal.  Has this in general with contacts.  Has had some headaches. Denies sneezing, runny nose, head congestion, sinus pressure.  Denies fever, sick contacts.  Denies cough, ear popping, ear pressure.  Using ibuprofen which helps some, motrin.  In the past doesn't recall fall allergies, but does get spring allergies.    Been having stomach cramps about an hour to 1.5 hours after eating.  Has had some loose stools since Sunday.  Boyfriend has had similar stomach symptoms, but he is better.   LMP about 3 wk ago.  Not due for period until next week.  Has had some back pain in low back.  Denies urinary symptoms, no vaginal symptoms.  Denies heartburn or burning in chest, but has been using tums.  No blood in stool, no mucous, no foul stool odor.   Getting about 2 loose stools daily.   Worse after larger meal.   Have rash or bug bite on back of leg x 2 weeks.   Getting some better, but wants this looked at.  She was sitting in a meeting at work, felt something, and later that day saw what she thought was a mosquito bite.  Few days later was itching.  Been using neosporin.  Over the week has gotten more red and larger.  Been trying some vagiseal cream.    Objective: Filed Vitals:   04/15/13 1339  BP: 110/70  Pulse: 78  Temp: 98 F (36.7 C)  Resp: 16    General appearance: alert, no distress, WD/WN HEENT: normocephalic, sclerae anicteric, TMs pearly, nares patent, no discharge or erythema, pharynx with tonsils 1-2+, but no erythema, there is some post nasal drainage though Oral cavity: MMM, no lesions Neck: supple, no lymphadenopathy, no thyromegaly, no masses Lungs: CTA bilaterally, no wheezes, rhonchi, or rales Abdomen: +bs, soft, mild tenderness left sided generalized, otherwise non tender, non distended, no  masses, no hepatomegaly, no splenomegaly Pulses: 2+ symmetric, upper and lower extremities, normal cap refill Skin: left upper thigh at crease between buttock and thigh with somewhat oval 8cm patch of pink/red coloration with raised border and somewhat clearing center.  No induration, fluctuance, warmth.    Assessment: Encounter Diagnoses  Name Primary?  . Sore throat Yes  . Loose stools   . Abdominal cramping   . Tinea corporis      Plan: Patient Instructions  Sore throat - likely either allergic trigger post nasal drip versus viral (less likely), versus acid reflux.  Consider Claritin or Zyrtec or Allegra at bedtime for a few weeks.  Consider salt water gargles, warm fluids, Motrin, sore throat spray, etc for discomfort.   Loose stools - may just be related to the greasy foods from the weekend.  Can't rule out other causes ,but doesn't sound worrisome .  Treatment: bland foods for 3-5 days, drink plenty of water, eat some yogurt daily .  Avoid spicy, greasy, fried, fast foods. Avoid soda, chocolate, and caffeine for now.  If symptoms worsen, or if new symptoms call back.  If loose stool persists beyond 14 days, call back.     Rash - appears to be ringworm/fungal.  Begin Lamisil cream OTC daily for 2+ weeks until this resolves.   However, if not any better in 2 weeks, call back.  Follow-up - call back 1-2 wk.

## 2013-04-15 NOTE — Patient Instructions (Signed)
Sore throat - likely either allergic trigger post nasal drip versus viral (less likely), versus acid reflux.  Consider Claritin or Zyrtec or Allegra at bedtime for a few weeks.  Consider salt water gargles, warm fluids, Motrin, sore throat spray, etc for discomfort.   Loose stools - may just be related to the greasy foods from the weekend.  Can't rule out other causes ,but doesn't sound worrisome .  Treatment: bland foods for 3-5 days, drink plenty of water, eat some yogurt daily .  Avoid spicy, greasy, fried, fast foods. Avoid soda, chocolate, and caffeine for now.  If symptoms worsen, or if new symptoms call back.  If loose stool persists beyond 14 days, call back.     Rash - appears to be ringworm/fungal.  Begin Lamisil cream OTC daily for 2+ weeks until this resolves.   However, if not any better in 2 weeks, call back.

## 2013-04-27 ENCOUNTER — Encounter: Payer: 59 | Admitting: Gynecology

## 2013-05-20 ENCOUNTER — Ambulatory Visit (INDEPENDENT_AMBULATORY_CARE_PROVIDER_SITE_OTHER): Payer: 59 | Admitting: Gynecology

## 2013-05-20 ENCOUNTER — Encounter: Payer: Self-pay | Admitting: Gynecology

## 2013-05-20 VITALS — BP 120/74 | Ht 69.0 in | Wt 208.0 lb

## 2013-05-20 DIAGNOSIS — Z01419 Encounter for gynecological examination (general) (routine) without abnormal findings: Secondary | ICD-10-CM

## 2013-05-20 LAB — LIPID PANEL
Cholesterol: 148 mg/dL (ref 0–200)
HDL: 42 mg/dL (ref >39)
Total CHOL/HDL Ratio: 3.5 Ratio
VLDL: 14 mg/dL (ref 0–40)

## 2013-05-20 LAB — CBC WITH DIFFERENTIAL/PLATELET
HCT: 40.9 % (ref 36.0–46.0)
Hemoglobin: 13.7 g/dL (ref 12.0–15.0)
Lymphs Abs: 2.1 10*3/uL (ref 0.7–4.0)
MCH: 27.5 pg (ref 26.0–34.0)
MCHC: 33.5 g/dL (ref 30.0–36.0)
Monocytes Absolute: 0.4 10*3/uL (ref 0.1–1.0)
Monocytes Relative: 7 % (ref 3–12)
Neutro Abs: 3.3 10*3/uL (ref 1.7–7.7)
Neutrophils Relative %: 56 % (ref 43–77)
RBC: 4.98 MIL/uL (ref 3.87–5.11)

## 2013-05-20 LAB — GLUCOSE, RANDOM: Glucose, Bld: 93 mg/dL (ref 70–99)

## 2013-05-20 MED ORDER — NORGESTIMATE-ETH ESTRADIOL 0.25-35 MG-MCG PO TABS: 1.0000 | ORAL_TABLET | Freq: Every day | ORAL | Status: DC

## 2013-05-20 NOTE — Progress Notes (Signed)
Tamara Spencer May 07, 1991 [id number]        22 y.o.  G0P0 for annual exam.  Doing well without complaints.  Past medical history,surgical history, problem list, medications, allergies, family history and social history were all reviewed and documented in the EPIC chart.  ROS:  Performed and pertinent positives and negatives are included in the history, assessment and plan .  Exam: Kim assistant Filed Vitals:   05/20/13 0826  BP: 120/74  Height: 5\' 9"  (1.753 m)  Weight: 208 lb (94.348 kg)   General appearance  Normal Skin grossly normal Head/Neck normal with no cervical or supraclavicular adenopathy thyroid normal Lungs  clear Cardiac RR, without RMG Abdominal  soft, nontender, without masses, organomegaly or hernia Breasts  examined lying and sitting without masses, retractions, discharge or axillary adenopathy. Pelvic  Ext/BUS/vagina  normal  Cervix  normal  Uterus  anteverted, normal size, shape and contour, midline and mobile nontender   Adnexa  Without masses or tenderness    Anus and perineum  normal   Rectovaginal  normal sphincter tone without palpated masses or tenderness.    Assessment/Plan:  22 y.o. G0P0 female for annual exam, regular menses, oral contraceptives.   1. Contraception. Patient doing well on oral contraceptives and wished to continue. I refilled her Ortho-Cyclen equivalent x1 year. 2. Headaches. Patient never followed up with neurologist. Has a headache once a week. No aura. Discussed possible increased risk of stroke associated with oral contraceptives. Again I strongly recommended she follow up with neurology and she agrees to schedule this. 3. Breast health. SBE monthly review. Mother with breast cancer age 58. Mother's maternal aunt with breast cancer in her 22s. I previously discussed my recommendation for genetic testing in her mother who is still alive. Her mother has never done this. I again encouraged the patient to discuss this with her mother and  have her tested. Implications to include significant increased risk of breast cancer lifelong as well as ovarian cancer if gene positive again reviewed. Possibilities for early screening including mammography/MRI also discussed. Patient promises to discuss this with her mother. 4. Pap smear 2012. No Pap smear done today. No history of abnormal Pap smears. 5. Gardasil series received. 6. History of recurrent cold sores. Uses Valtrex intermittently. Will call when needs prescription. 7. Health maintenance. Baseline CBC glucose lipid profile urinalysis ordered. Followup one year, sooner as needed.  Note: This document was prepared with digital dictation and possible smart phrase technology. Any transcriptional errors that result from this process are unintentional.   Dara Lords MD, 8:56 AM 05/20/2013

## 2013-05-20 NOTE — Patient Instructions (Signed)
Make appointment with Dr Amelia Jo, neurologist. Discuss genetic testing with your mother in reference to the breast cancer gene Follow up in one year for annual exam

## 2013-05-21 LAB — URINALYSIS W MICROSCOPIC + REFLEX CULTURE
Bilirubin Urine: NEGATIVE
Glucose, UA: NEGATIVE mg/dL
Ketones, ur: NEGATIVE mg/dL
Protein, ur: NEGATIVE mg/dL
Urobilinogen, UA: 0.2 mg/dL (ref 0.0–1.0)

## 2013-05-22 LAB — URINE CULTURE: Organism ID, Bacteria: NO GROWTH

## 2013-07-26 ENCOUNTER — Emergency Department
Admission: EM | Admit: 2013-07-26 | Discharge: 2013-07-26 | Disposition: A | Discharge status: Home / Self Care | Payer: 59 | Source: Home / Self Care | Attending: Family Medicine | Admitting: Family Medicine

## 2013-07-26 ENCOUNTER — Encounter: Payer: Self-pay | Admitting: Emergency Medicine

## 2013-07-26 DIAGNOSIS — J029 Acute pharyngitis, unspecified: Secondary | ICD-10-CM

## 2013-07-26 DIAGNOSIS — J069 Acute upper respiratory infection, unspecified: Secondary | ICD-10-CM

## 2013-07-26 LAB — POCT RAPID STREP A (OFFICE): Rapid Strep A Screen: NEGATIVE

## 2013-07-26 MED ORDER — AZITHROMYCIN 250 MG PO TABS: ORAL_TABLET | ORAL | Status: DC

## 2013-07-26 NOTE — ED Notes (Signed)
Patient reports sore throat, body aches and stomach pain; denies fever. No OTC this a.m.

## 2013-07-26 NOTE — ED Provider Notes (Signed)
CSN: [id number]     Arrival date & time 07/26/13  1109 History   First MD Initiated Contact with Patient 07/26/13 1135     Chief Complaint  Patient presents with  . Sore Throat      HPI Comments: Patient developed a mild sore throat two days ago.  She has had mild myalgias, headache, and fatigue.  No nasal congestion or cough  The history is provided by the patient.    Past Medical History  Diagnosis Date  . Recurrent cold sores    Past Surgical History  Procedure Laterality Date  . Wisdom tooth extraction     Family History  Problem Relation Age of Onset  . Diabetes Father   . Hypertension Father   . Cancer Maternal Aunt     great aunt --colon (late 80's)  . Diabetes Paternal Grandfather   . Breast cancer Mother     Age 64  . Other Mother     Myleofibrosis  . Breast cancer Maternal Aunt     Age 14's   History  Substance Use Topics  . Smoking status: Never Smoker   . Smokeless tobacco: Never Used  . Alcohol Use: No   OB History   Grav Para Term Preterm Abortions TAB SAB Ect Mult Living   0              Review of Systems + sore throat No cough No pleuritic pain No wheezing Nonasal congestion No post-nasal drainage No sinus pain/pressure No itchy/red eyes No earache No hemoptysis No SOB No fever/chills No nausea No vomiting ? abdominal pain No diarrhea No urinary symptoms No skin rash + fatigue ? myalgias + headache Used OTC meds without relief  Allergies  Review of patient's allergies indicates no known allergies.  Home Medications   Current Outpatient Rx  Name  Route  Sig  Dispense  Refill  . azithromycin (ZITHROMAX Z-PAK) 250 MG tablet      Take 2 tabs today; then begin one tab once daily for 4 more days. (Rx void after 08/03/13)   6 each   0   . loratadine (CLARITIN) 10 MG tablet   Oral   Take 10 mg by mouth daily.         . Multiple Vitamins-Minerals (MULTIVITAMIN WITH MINERALS) tablet   Oral   Take 1 tablet by mouth daily.        . norgestimate-ethinyl estradiol (ORTHO-CYCLEN,SPRINTEC,PREVIFEM) 0.25-35 MG-MCG tablet   Oral   Take 1 tablet by mouth daily.   3 Package   4   . EXPIRED: valACYclovir (VALTREX) 500 MG tablet   Oral   Take 1 tablet (500 mg total) by mouth 2 (two) times daily.   30 tablet   12    BP 131/88  Pulse 86  Temp(Src) 98.4 F (36.9 C) (Oral)  Resp 16  Ht 5\' 9"  (1.753 m)  Wt 200 lb (90.719 kg)  BMI 29.52 kg/m2  SpO2 98%  LMP 07/16/2013 Physical Exam Nursing notes and Vital Signs reviewed. Appearance:  Patient appears healthy, stated age, and in no acute distress Eyes:  Pupils are equal, round, and reactive to light and accomodation.  Extraocular movement is intact.  Conjunctivae are not inflamed  Ears:  Canals normal.  Tympanic membranes normal.  Nose:  Mildly congested turbinates.  No sinus tenderness.   Pharynx:  Normal Neck:  Supple.  Slightly tender shotty posterior nodes are palpated bilaterally  Lungs:  Clear to auscultation.  Breath sounds are  equal.  Heart:  Regular rate and rhythm without murmurs, rubs, or gallops.  Abdomen:  Nontender without masses or hepatosplenomegaly.  Bowel sounds are present.  No CVA or flank tenderness.  Extremities:  No edema.  No calf tenderness Skin:  No rash present.   ED Course  Procedures  none    Labs Reviewed  STREP A DNA PROBE  POCT RAPID STREP A (OFFICE) negative         MDM   1. Acute pharyngitis   2. Acute upper respiratory infections of unspecified site; suspect viral URI    Throat culture pending There is no evidence of bacterial infection today.   Treat symptomatically for now: Take Mucinex D (1200mg  guaifenesin with decongestant) twice daily for congestion.  Increase fluid intake, rest. May use Afrin nasal spray (or generic oxymetazoline) twice daily for about 5 days.  Also recommend using saline nasal spray several times daily and saline nasal irrigation (AYR is a common brand) Try warm salt water gargles for  sore throat.  Stop all antihistamines for now, and other non-prescription cough/cold preparations. May take Ibuprofen 200mg , 4 tabs every 8 hours with food for sore throat, headache, etc. May take Delsym Cough Suppressant at bedtime for nighttime cough.  Begin Azithromycin if not improving about one week or if persistent fever develops (Given a prescription to hold, with an expiration date)  Follow-up with family doctor if not improving about10 to 14 days.    Kandra Nicolas, MD 07/29/13 [phone]

## 2013-07-26 NOTE — Discharge Instructions (Signed)
Take Mucinex D (1200mg  guaifenesin with decongestant) twice daily for congestion.  Increase fluid intake, rest. May use Afrin nasal spray (or generic oxymetazoline) twice daily for about 5 days.  Also recommend using saline nasal spray several times daily and saline nasal irrigation (AYR is a common brand) Try warm salt water gargles for sore throat.  Stop all antihistamines for now, and other non-prescription cough/cold preparations. May take Ibuprofen 200mg , 4 tabs every 8 hours with food for sore throat, headache, etc. May take Delsym Cough Suppressant at bedtime for nighttime cough.  Begin Azithromycin if not improving about one week or if persistent fever develops  Follow-up with family doctor if not improving about10 to 14 days.

## 2013-07-27 LAB — STREP A DNA PROBE: GASP: NEGATIVE

## 2013-07-30 ENCOUNTER — Telehealth: Payer: Self-pay | Admitting: Emergency Medicine

## 2013-10-05 ENCOUNTER — Telehealth: Payer: Self-pay | Admitting: Family Medicine

## 2013-10-05 NOTE — Telephone Encounter (Signed)
Pt sent in a message on my chart. Pt was called and informed of Dr. Pascal Lux cpe schedule. Pt was asked to call office to set something up.

## 2013-11-02 ENCOUNTER — Other Ambulatory Visit: Payer: Self-pay | Admitting: Women's Health

## 2014-01-11 ENCOUNTER — Encounter: Payer: 59 | Admitting: Family Medicine

## 2014-03-11 ENCOUNTER — Ambulatory Visit (INDEPENDENT_AMBULATORY_CARE_PROVIDER_SITE_OTHER): Payer: 59 | Admitting: Family Medicine

## 2014-03-11 ENCOUNTER — Encounter: Payer: Self-pay | Admitting: Family Medicine

## 2014-03-11 VITALS — BP 140/96 | HR 80 | Ht 68.0 in | Wt 197.0 lb

## 2014-03-11 DIAGNOSIS — B001 Herpesviral vesicular dermatitis: Secondary | ICD-10-CM

## 2014-03-11 DIAGNOSIS — R635 Abnormal weight gain: Secondary | ICD-10-CM

## 2014-03-11 DIAGNOSIS — R030 Elevated blood-pressure reading, without diagnosis of hypertension: Secondary | ICD-10-CM

## 2014-03-11 DIAGNOSIS — Z0000 Encounter for general adult medical examination without abnormal findings: Secondary | ICD-10-CM

## 2014-03-11 DIAGNOSIS — B009 Herpesviral infection, unspecified: Secondary | ICD-10-CM

## 2014-03-11 DIAGNOSIS — Z803 Family history of malignant neoplasm of breast: Secondary | ICD-10-CM

## 2014-03-11 DIAGNOSIS — IMO0001 Reserved for inherently not codable concepts without codable children: Secondary | ICD-10-CM | POA: Insufficient documentation

## 2014-03-11 LAB — POCT URINALYSIS DIPSTICK
BILIRUBIN UA: NEGATIVE
Glucose, UA: NEGATIVE
KETONES UA: NEGATIVE
NITRITE UA: NEGATIVE
Protein, UA: NEGATIVE
Spec Grav, UA: 1.025
Urobilinogen, UA: NEGATIVE
pH, UA: 5

## 2014-03-11 LAB — TSH: TSH: 0.985 u[IU]/mL (ref 0.350–4.500)

## 2014-03-11 MED ORDER — VALACYCLOVIR HCL 1 G PO TABS: ORAL_TABLET | ORAL | Status: DC

## 2014-03-11 NOTE — Progress Notes (Signed)
Chief Complaint  Patient presents with  . Annual Exam    nonfasting annual exam without pap, sees Dr.Fontaine. Did not do eye exam, had one last month wth Dr.Oman. UA showed trace blood and leuks-no symptoms. Did want to talk about weightloss-has lost 30 lbs this year and is currently at a stand still. Needs a new rx for valtrex-would like rx for 1052m instrad of 5089m Left shoulder has a mole that she wold like you to look at.    Tamara Spencer a [age].o. female who presents for a complete physical.  She has the following concerns:  Weight concerns:  She had a bad breakup at the end of 2013, emotional eating, gained weight. She was 22 pounds lighter than today at her last CPE in 10/2011.   Per flowsheet, was up to 208 in 05/2013.  Earlier in 2015 her weight got up to almost 220#.  She cut back on fried foods and regular soda (does one or the other once daily still);  Earlier in the summer she started running (not in the last few weeks due to best friend's wedding).  Does some cardio games/dance on X-box.  She is cooking at home more, eating less fast food.  She hasn't been able to lose any further weight in the last couple of months.  At one point was using [employer]itness[employer], but recently was just trying to eat healthier.  Wondering what her goal weight should be.  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  Gets one every month with her cycle.  Taking 2grams x 2 has worked well (late in taking with this episode)--requesting change from 50075mo 1000m60m take fewer pills.  Family h/o breast cancer--GYN recommended that her mother get genetic testing.  Since her visit, she had the testing, and her mother is NOT a BRCA gene carrier.  Mole on her left shoulder first noticed in 2012, recently growing some.  2 moles removed in past (precancerous from her scalp).  Would like checked today.  She isn't sure if it is a mole, cyst.  She tried popping it, but wasn't able to.  Immunization History  Administered  Date(s) Administered  . HPV Quadrivalent 02/02/2011, 03/15/2011, 07/06/2011  . Influenza Split 04/08/2012  . Influenza Whole 04/15/2013  . Td 07/09/2002  . Tdap 04/11/2009   Last Pap smear: 01/2011; last GYN exam with Dr. FontPhineas Real2014 Last mammogram: never  Last colonoscopy: never  Last DEXA: never  Dentist: twice yearly  Ophtho: yearly, wears contacts  Exercise: at least 3x/week (running, and cardio on x-box) Lipids and CBC done by GYN in November (see below)  Past Medical History  Diagnosis Date  . Recurrent cold sores   . Headache     monthly; less severe since on new OCP    Past Surgical History  Procedure Laterality Date  . Wisdom tooth extraction      History   Social History  . Marital Status: Single    Spouse Name: N/A    Number of Children: N/A  . Years of Education: N/A   Occupational History  . IT security team [employer]   Social History Main Topics  . Smoking status: Never Smoker   . Smokeless tobacco: Never Used  . Alcohol Use: No  . Drug Use: No  . Sexual Activity: Yes    Partners: Male    Birth Control/ Protection: Condom, Pill   Other Topics Concern  . Not on file   Social History Narrative  Lives with her parents, dog.  Engaged to be married (10/2014).    Family History  Problem Relation Age of Onset  . Diabetes Father   . Hypertension Father   . Hyperlipidemia Father   . Cancer Maternal Aunt     great aunt --colon (late 64's)  . Diabetes Paternal Grandfather   . Breast cancer Mother 70    BRCA gene testing negative in 2015  . Other Mother     Myleofibrosis  . Breast cancer Maternal Aunt     Age 19's  . Heart disease Neg Hx    Outpatient Encounter Prescriptions as of 03/11/2014  Medication Sig  . Multiple Vitamins-Minerals (MULTIVITAMIN WITH MINERALS) tablet Take 1 tablet by mouth daily.  . norgestimate-ethinyl estradiol (ORTHO-CYCLEN,SPRINTEC,PREVIFEM) 0.25-35 MG-MCG tablet Take 1 tablet by mouth daily.  . valACYclovir  (VALTREX) 1000 MG tablet Take 2 tablets at onset of cold sore.  Repeat dose in 12 hours (4 tablets per course)  . [DISCONTINUED] valACYclovir (VALTREX) 500 MG tablet TAKE 1 TABLET BY MOUTH 2 TIMES DAILY.  Marland Kitchen loratadine (CLARITIN) 10 MG tablet Take 10 mg by mouth daily.  . [DISCONTINUED] azithromycin (ZITHROMAX Z-PAK) 250 MG tablet Take 2 tabs today; then begin one tab once daily for 4 more days. (Rx void after 08/03/13)   (changed from 500 to 1079m of valtrex today)  No Known Allergies  ROS: The patient denies anorexia, fever,  vision changes, decreased hearing, ear pain, sore throat, breast concerns, chest pain, palpitations, dizziness, syncope, dyspnea on exertion, cough, swelling, nausea, vomiting, diarrhea, constipation, abdominal pain, melena, hematochezia, indigestion/heartburn, hematuria, incontinence, dysuria, vaginal discharge, odor or itch, genital lesions, joint pains, numbness, tingling, weakness, tremor, suspicious skin lesions, depression, anxiety, abnormal bleeding/bruising (always bruises easily), or enlarged lymph nodes.   Headaches--much improved--still gets monthly with cycles, but much less severe.  Relieved by ibuprofen.   PHYSICAL EXAM:  BP 140/96  Pulse 80  Ht '5\' 8"'  (1.727 m)  Wt 197 lb (89.359 kg)  BMI 29.96 kg/m2  LMP 03/03/2014 134/90 on repeat by MD General Appearance:  Alert, cooperative, no distress, appears stated age   Head:  Normocephalic, without obvious abnormality, atraumatic   Eyes:  PERRL, conjunctiva/corneas clear, EOM's intact, fundi  benign   Ears:  Normal TM's and external ear canals   Nose:  Nares normal, mucosa normal, no drainage or sinus tenderness   Throat:  Lips, mucosa, and tongue normal; teeth and gums normal   Neck:  Supple, no lymphadenopathy; thyroid: no enlargement/tenderness/nodules; no carotid  bruit or JVD   Back:  Spine nontender, no curvature, ROM normal, no CVA tenderness   Lungs:  Clear to auscultation bilaterally without  wheezes, rales or ronchi; respirations unlabored   Chest Wall:  No tenderness or deformity   Heart:  Regular rate and rhythm, S1 and S2 normal, no murmur, rub  or gallop   Breast Exam:  deferred to GYN   Abdomen:  Soft, non-tender, nondistended, normoactive bowel sounds,  no masses, no hepatosplenomegaly   Genitalia:  deferred to GYN   Rectal:  Not performed due to age<40 and no related complaints   Extremities:  No clubbing, cyanosis or edema   Pulses:  2+ and symmetric all extremities   Skin:  Skin color, texture, turgor normal, no rashes. Cold sore at right lateral lower lip.  Left shoulder--raised, somewhat pearly/shiny erythematous lesion that appears to be two round lesions, connected (almost hour-glass shape).  Length is 17m width is 1070m Nontender.  Sebaceous cyst (  nontender, no erythema) at crown, on scalp.  Lymph nodes:  Cervical, supraclavicular, and axillary nodes normal   Neurologic:  CNII-XII intact, normal strength, sensation and gait; reflexes 2+ and symmetric throughout           Psych: Normal mood, affect, hygiene and grooming.   Recent labs (from GYN) Lab Results  Component Value Date   CHOL 148 05/20/2013   HDL 42 05/20/2013   LDLCALC 92 05/20/2013   TRIG 68 05/20/2013   CHOLHDL 3.5 05/20/2013   Lab Results  Component Value Date   WBC 5.9 05/20/2013   HGB 13.7 05/20/2013   HCT 40.9 05/20/2013   MCV 82.1 05/20/2013   PLT 311 05/20/2013     ASSESSMENT/PLAN:  Routine general medical examination at a health care facility - Plan: POCT Urinalysis Dipstick, TSH  Herpes labialis - Plan: valACYclovir (VALTREX) 1000 MG tablet  Weight gain - counseled extensively re: additional changes to make to help with weight loss - Plan: TSH  Elevated blood pressure - counseled re: low sodium diet, daily exercise, weight loss  Family history of breast cancer in mother - pt due to start screening now. She plans to discuss with her GYN at her upcoming exam   Elevated  BP--check elsewhere; continue daily exercise, low sodium diet and weight loss.  Reviewed diet and goals (initial goal)--initial goal 180.  Discussed monthly self breast exams and breast cancer screening at age 81 given family history (mammo vs MRI, will discuss with GYN at visit in a couple of months); at least 30 minutes of aerobic activity at least 5 days/week; proper sunscreen use reviewed; healthy diet, including goals of calcium and vitamin D intake and alcohol recommendations (less than or equal to 1 drink/day) reviewed; regular seatbelt use; changing batteries in smoke detectors. Immunization recommendations discussed--Flu shot--will get at work  Schedule 30 min excision of lesions L shoulder--r/o BCC

## 2014-03-11 NOTE — Patient Instructions (Signed)
HEALTH MAINTENANCE RECOMMENDATIONS:  It is recommended that you get at least 30 minutes of aerobic exercise at least 5 days/week (for weight loss, you may need as much as 60-90 minutes). This can be any activity that gets your heart rate up. This can be divided in 10-15 minute intervals if needed, but try and build up your endurance at least once a week.  Weight bearing exercise is also recommended twice weekly.  Eat a healthy diet with lots of vegetables, fruits and fiber.  "Colorful" foods have a lot of vitamins (ie green vegetables, tomatoes, red peppers, etc).  Limit sweet tea, regular sodas and alcoholic beverages, all of which has a lot of calories and sugar.  Up to 1 alcoholic drink daily may be beneficial for women (unless trying to lose weight, watch sugars).  Drink a lot of water.  Calcium recommendations are 1200-1500 mg daily (1500 mg for postmenopausal women or women without ovaries), and vitamin D 1000 IU daily.  This should be obtained from diet and/or supplements (vitamins), and calcium should not be taken all at once, but in divided doses.  Monthly self breast exams and yearly mammograms for women over the age of 39 is recommended.  Sunscreen of at least SPF 30 should be used on all sun-exposed parts of the skin when outside between the hours of 10 am and 4 pm (not just when at beach or pool, but even with exercise, golf, tennis, and yard work!)  Use a sunscreen that says "broad spectrum" so it covers both UVA and UVB rays, and make sure to reapply every 1-2 hours.  Remember to change the batteries in your smoke detectors when changing your clock times in the spring and fall.  Use your seat belt every time you are in a car, and please drive safely and not be distracted with cell phones and texting while driving.  Low-Sodium Eating Plan Sodium raises blood pressure and causes water to be held in the body. Getting less sodium from food will help lower your blood pressure, reduce any  swelling, and protect your heart, liver, and kidneys. We get sodium by adding salt (sodium chloride) to food. Most of our sodium comes from canned, boxed, and frozen foods. Restaurant foods, fast foods, and pizza are also very high in sodium. Even if you take medicine to lower your blood pressure or to reduce fluid in your body, getting less sodium from your food is important. WHAT IS MY PLAN? Most people should limit their sodium intake to 2,300 mg a day. Your health care provider recommends that you limit your sodium intake to __________ a day.  WHAT DO I NEED TO KNOW ABOUT THIS EATING PLAN? For the low-sodium eating plan, you will follow these general guidelines:  Choose foods with a % Daily Value for sodium of less than 5% (as listed on the food label).   Use salt-free seasonings or herbs instead of table salt or sea salt.   Check with your health care provider or pharmacist before using salt substitutes.   Eat fresh foods.  Eat more vegetables and fruits.  Limit canned vegetables. If you do use them, rinse them well to decrease the sodium.   Limit cheese to 1 oz (28 g) per day.   Eat lower-sodium products, often labeled as "lower sodium" or "no salt added."  Avoid foods that contain monosodium glutamate (MSG). MSG is sometimes added to [place] food and some canned foods.  Check food labels (Nutrition Facts labels) on foods  to learn how much sodium is in one serving.  Eat more home-cooked food and less restaurant, buffet, and fast food.  When eating at a restaurant, ask that your food be prepared with less salt or none, if possible.  HOW DO I READ FOOD LABELS FOR SODIUM INFORMATION? The Nutrition Facts label lists the amount of sodium in one serving of the food. If you eat more than one serving, you must multiply the listed amount of sodium by the number of servings. Food labels may also identify foods as:  Sodium free--Less than 5 mg in a serving.  Very low  sodium--35 mg or less in a serving.  Low sodium--140 mg or less in a serving.  Light in sodium--50% less sodium in a serving. For example, if a food that usually has 300 mg of sodium is changed to become light in sodium, it will have 150 mg of sodium.  Reduced sodium--25% less sodium in a serving. For example, if a food that usually has 400 mg of sodium is changed to reduced sodium, it will have 300 mg of sodium. WHAT FOODS CAN I EAT? Grains Low-sodium cereals, including oats, puffed wheat and rice, and shredded wheat cereals. Low-sodium crackers. Unsalted rice and pasta. Lower-sodium bread.  Vegetables Frozen or fresh vegetables. Low-sodium or reduced-sodium canned vegetables. Low-sodium or reduced-sodium tomato sauce and paste. Low-sodium or reduced-sodium tomato and vegetable juices.  Fruits Fresh, frozen, and canned fruit. Fruit juice.  Meat and Other Protein Products Low-sodium canned tuna and salmon. Fresh or frozen meat, poultry, seafood, and fish. Lamb. Unsalted nuts. Dried beans, peas, and lentils without added salt. Unsalted canned beans. Homemade soups without salt. Eggs.  Dairy Milk. Soy milk. Ricotta cheese. Low-sodium or reduced-sodium cheeses. Yogurt.  Condiments Fresh and dried herbs and spices. Salt-free seasonings. Onion and garlic powders. Low-sodium varieties of mustard and ketchup. Lemon juice.  Fats and Oils Reduced-sodium salad dressings. Unsalted butter.  Other Unsalted popcorn and pretzels.  The items listed above may not be a complete list of recommended foods or beverages. Contact your dietitian for more options. WHAT FOODS ARE NOT RECOMMENDED? Grains Instant hot cereals. Bread stuffing, pancake, and biscuit mixes. Croutons. Seasoned rice or pasta mixes. Noodle soup cups. Boxed or frozen macaroni and cheese. Self-rising flour. Regular salted crackers. Vegetables Regular canned vegetables. Regular canned tomato sauce and paste. Regular tomato and  vegetable juices. Frozen vegetables in sauces. Salted french fries. Olives. Angie Fava. Relishes. Sauerkraut. Salsa. Meat and Other Protein Products Salted, canned, smoked, spiced, or pickled meats, seafood, or fish. Bacon, ham, sausage, hot dogs, corned beef, chipped beef, and packaged luncheon meats. Salt pork. Jerky. Pickled herring. Anchovies, regular canned tuna, and sardines. Salted nuts. Dairy Processed cheese and cheese spreads. Cheese curds. Blue cheese and cottage cheese. Buttermilk.  Condiments Onion and garlic salt, seasoned salt, table salt, and sea salt. Canned and packaged gravies. Worcestershire sauce. Tartar sauce. Barbecue sauce. Teriyaki sauce. Soy sauce, including reduced sodium. Steak sauce. Fish sauce. Oyster sauce. Cocktail sauce. Horseradish. Regular ketchup and mustard. Meat flavorings and tenderizers. Bouillon cubes. Hot sauce. Tabasco sauce. Marinades. Taco seasonings. Relishes. Fats and Oils Regular salad dressings. Salted butter. Margarine. Ghee. Bacon fat.  Other Potato and tortilla chips. Corn chips and puffs. Salted popcorn and pretzels. Canned or dried soups. Pizza. Frozen entrees and pot pies.  The items listed above may not be a complete list of foods and beverages to avoid. Contact your dietitian for more information. Document Released: 12/15/2001 Document Revised: 06/30/2013 Document Reviewed: 04/29/2013  ExitCare Patient Information 2015 [employer]. This information is not intended to replace advice given to you by your health care provider. Make sure you discuss any questions you have with your health care provider.  

## 2014-04-23 ENCOUNTER — Other Ambulatory Visit: Payer: Self-pay

## 2014-04-25 IMAGING — CR DG FOOT COMPLETE 3+V*L*
3 series · 3 of 3 positions shown · non-contrast
Comparison: None.

CLINICAL DATA: Lateral foot pain following injury 3 days ago.

LEFT FOOT - COMPLETE 3+ VIEW

[t foot ap left]
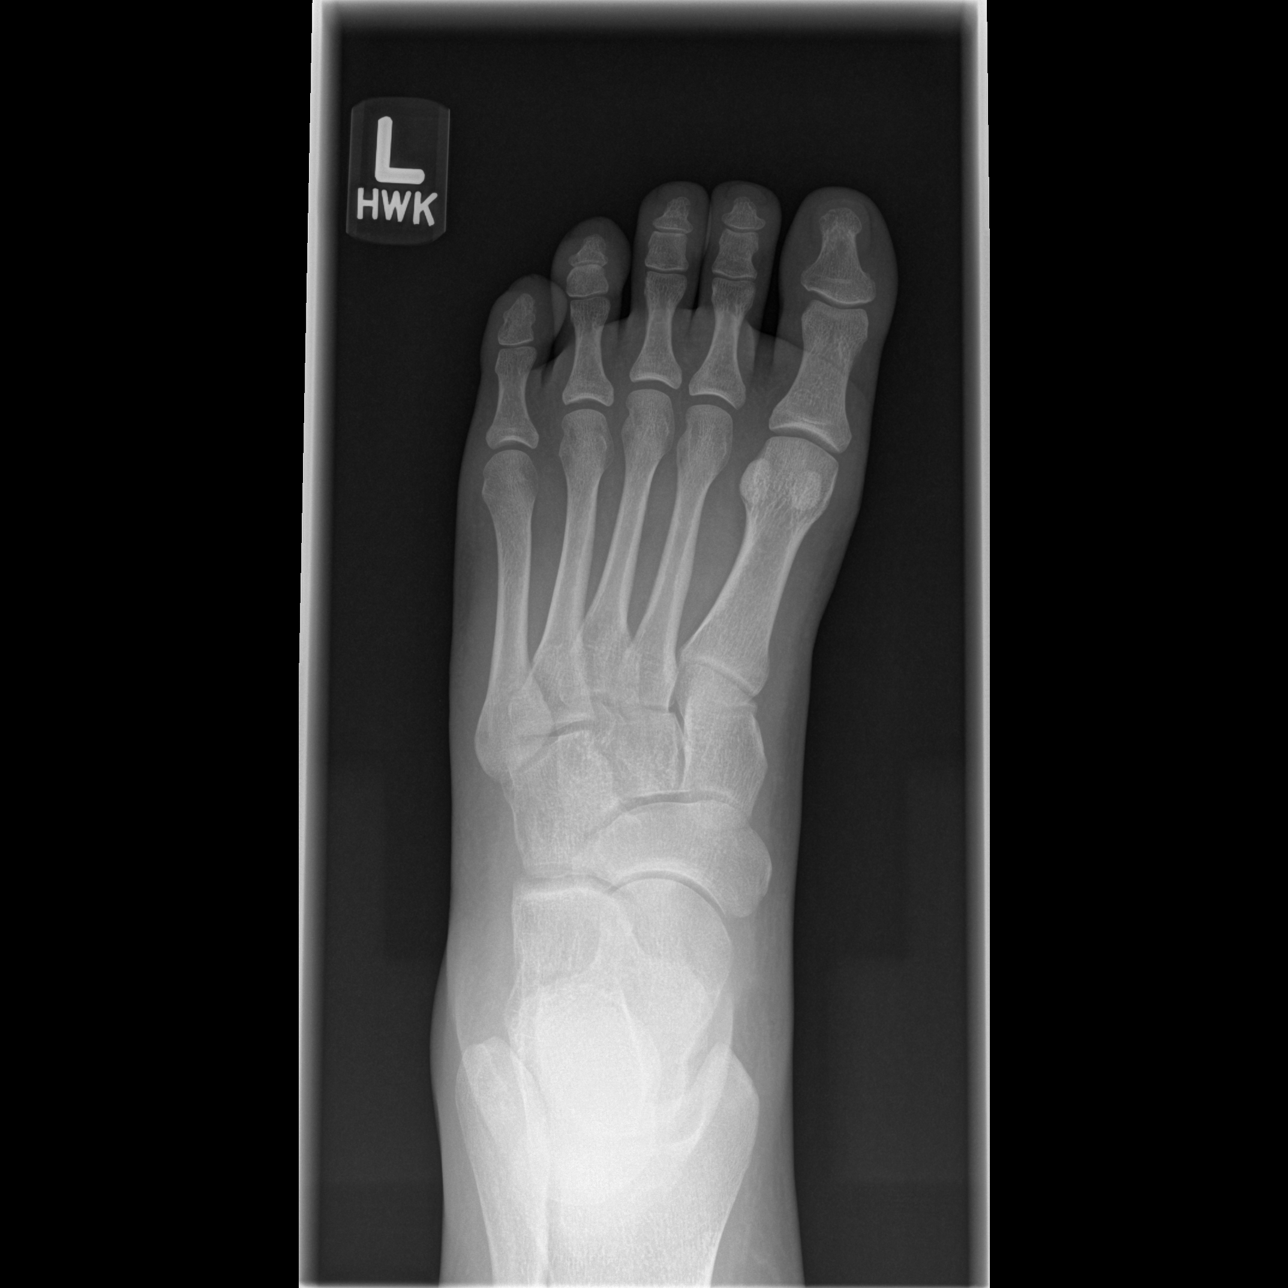

[t foot oblique left]
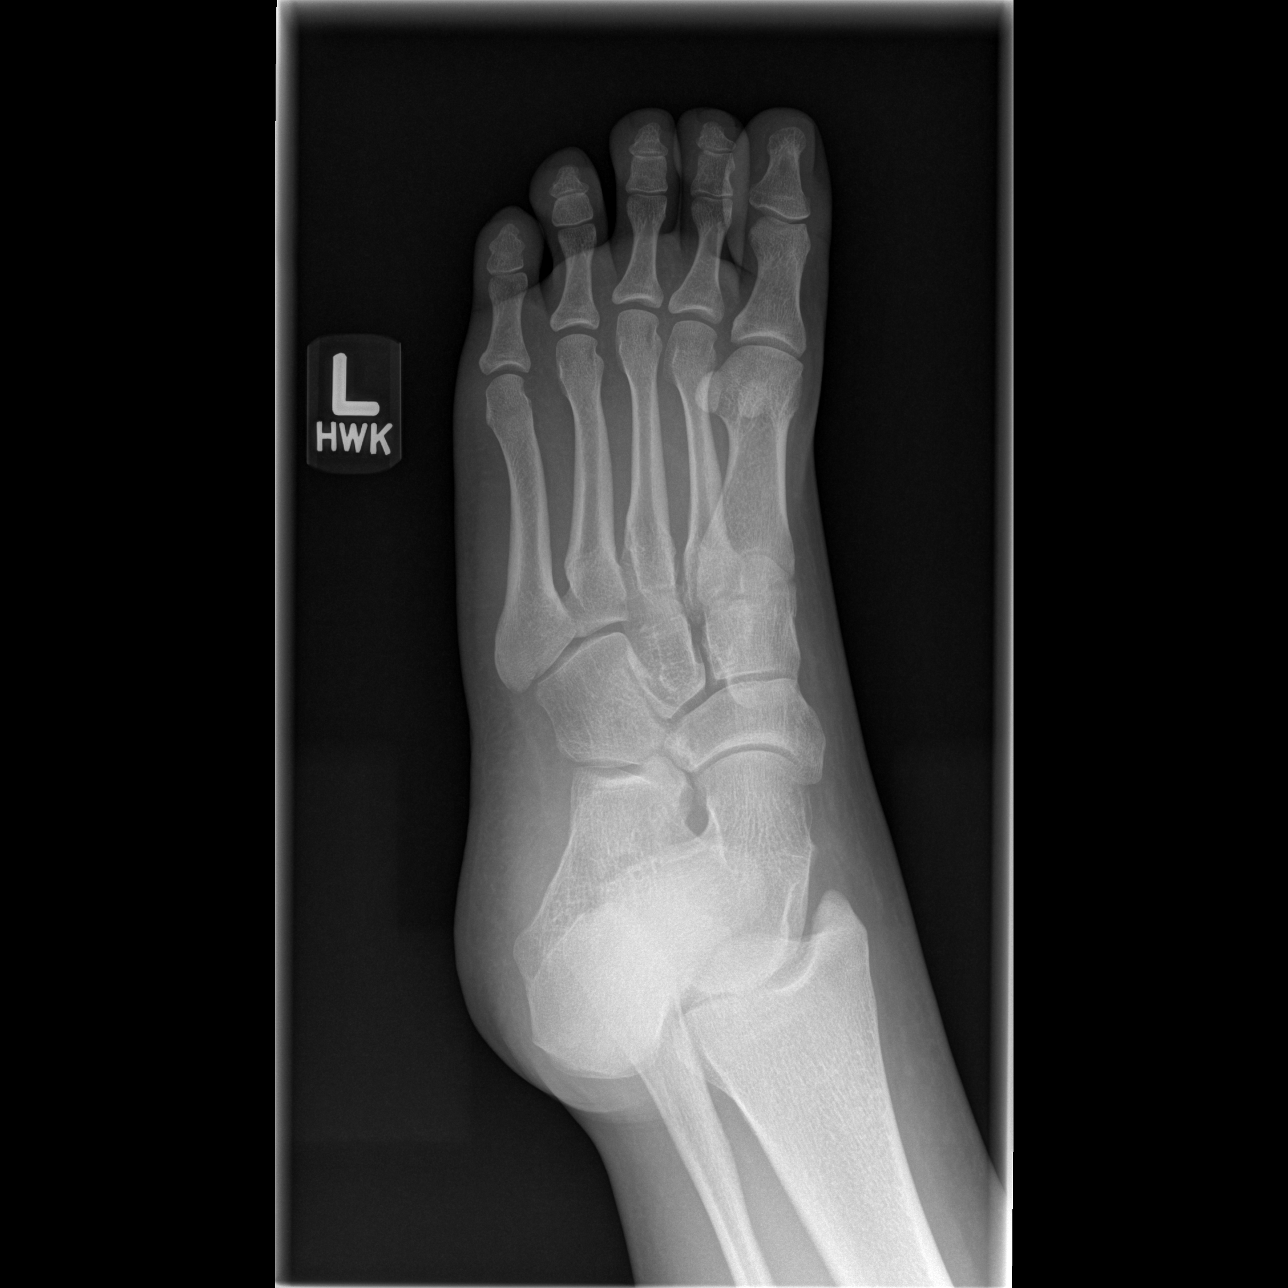

[t foot lat left]
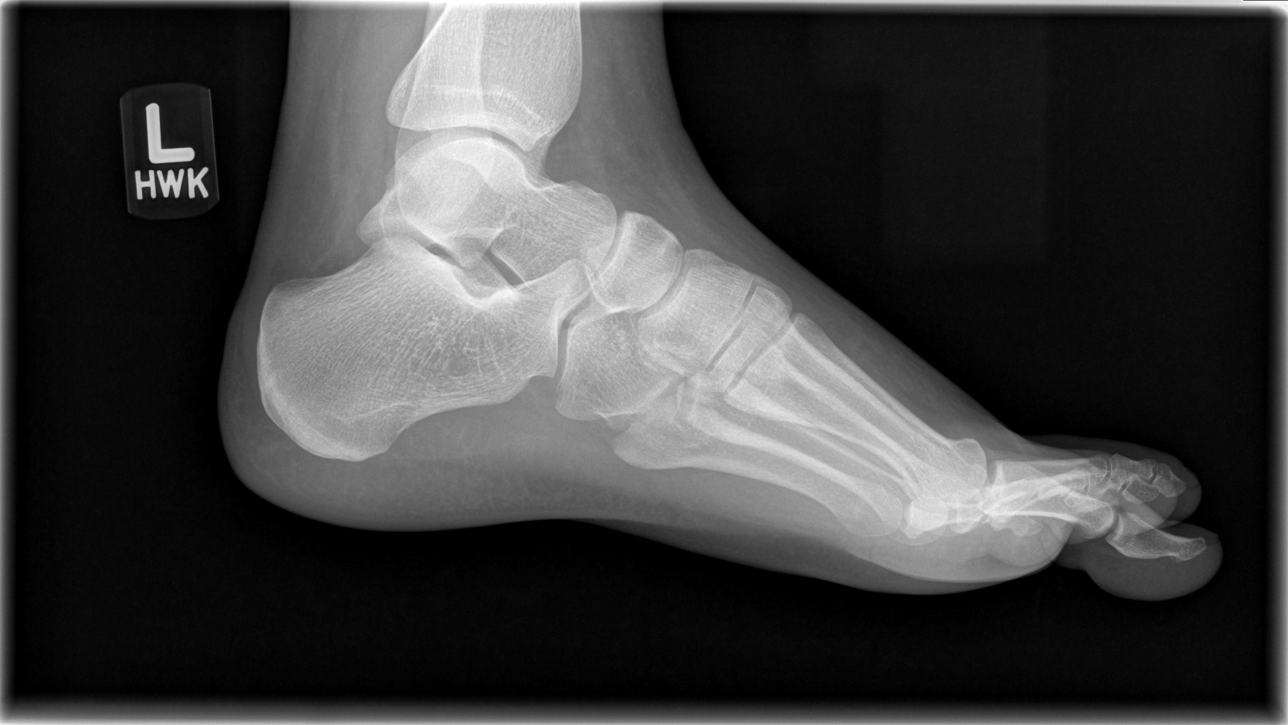

[3 of 3 positions shown; findings below may reference images not displayed]

FINDINGS: Minimal articular surface irregularity at the base of the
fifth metatarsal on the oblique view is favored to be secondary to
a fused secondary ossification center.  No definite cortical
fracture or adjacent soft tissue swelling is identified.  There is
no subluxation.
IMPRESSION: No definite acute osseous findings.  Slight irregularity of the
fifth metatarsal base is likely developmental.

## 2014-04-28 ENCOUNTER — Ambulatory Visit (INDEPENDENT_AMBULATORY_CARE_PROVIDER_SITE_OTHER): Payer: 59 | Admitting: Family Medicine

## 2014-04-28 ENCOUNTER — Other Ambulatory Visit: Payer: Self-pay | Admitting: Family Medicine

## 2014-04-28 ENCOUNTER — Encounter: Payer: Self-pay | Admitting: Family Medicine

## 2014-04-28 VITALS — BP 144/90 | HR 76 | Ht 68.5 in | Wt 200.0 lb

## 2014-04-28 DIAGNOSIS — D489 Neoplasm of uncertain behavior, unspecified: Secondary | ICD-10-CM

## 2014-04-28 NOTE — Progress Notes (Signed)
Chief Complaint  Patient presents with  . shoulder skin lesion    shoulder skin lesion, already had flu shot   Patient presents for excisional biopsy of enlarging lesion on her left shoulder.    Current Outpatient Prescriptions on File Prior to Visit  Medication Sig Dispense Refill  . loratadine (CLARITIN) 10 MG tablet Take 10 mg by mouth daily.      . Multiple Vitamins-Minerals (MULTIVITAMIN WITH MINERALS) tablet Take 1 tablet by mouth daily.      . norgestimate-ethinyl estradiol (ORTHO-CYCLEN,SPRINTEC,PREVIFEM) 0.25-35 MG-MCG tablet Take 1 tablet by mouth daily.  3 Package  4  . valACYclovir (VALTREX) 1000 MG tablet Take 2 tablets at onset of cold sore.  Repeat dose in 12 hours (4 tablets per course)  45 tablet  0   No current facility-administered medications on file prior to visit.   No Known Allergies  PHYSICAL EXAM: BP 144/90  Pulse 76  Ht 5' 8.5" (1.74 m)  Wt 200 lb (90.719 kg)  BMI 29.96 kg/m2  Pleasant female in no distress Left upper shoulder:   Dumbbell shaped raised, slightly pink firm lesion, measuring 2 cm in length, 1cm in width at upper and lower portion, with 0.6cm at isthmus.  Nontender.  No fluctuance.   ASSESSMENT/PLAN:  Neoplasm of uncertain behavior - Plan: PR EXC SKIN BENIG 3.1-4 CM TRUNK,ARM,LEG  Procedure: Risks/benefits/alternatives reviewed.  Elected to proceed with excisional biopsy.  Area was anesthetized with 2% lidocaine with epinephrine.  Elliptical incision (of approx 3.5 cm length) was made using 15 blade, completely excising the lesion.  4-0 nylon via 7 interrupted simple sutures were used to close up the wound.  Wound margins were well approximated, and there was minimal blood loss. Wound was dressed with bacitracin and bandage.  She tolerated the procedure well without complication.  She felt slightly lightheaded upon sitting, but after lying down a few moments longer, felt fine.  Return in 12 days for suture removal, sooner prn. Wound  care instructions and signs of infection were reviewed.

## 2014-04-28 NOTE — Patient Instructions (Signed)
Sutured Wound Care °Sutures are stitches that can be used to close wounds. Wound care helps prevent pain and infection.  °HOME CARE INSTRUCTIONS  °· Rest and elevate the injured area until all the pain and swelling are gone. °· Only take over-the-counter or prescription medicines for pain, discomfort, or fever as directed by your caregiver. °· After 48 hours, gently wash the area with mild soap and water once a day, or as directed. Rinse off the soap. Pat the area dry with a clean towel. Do not rub the wound. This may cause bleeding. °· Follow your caregiver's instructions for how often to change the bandage (dressing). Stop using a dressing after 2 days or after the wound stops draining. °· If the dressing sticks, moisten it with soapy water and gently remove it. °· Apply ointment on the wound as directed. °· Avoid stretching a sutured wound. °· Drink enough fluids to keep your urine clear or pale yellow. °· Follow up with your caregiver for suture removal as directed. °· Use sunscreen on your wound for the next 3 to 6 months so the scar will not darken. °SEEK IMMEDIATE MEDICAL CARE IF:  °· Your wound becomes red, swollen, hot, or tender. °· You have increasing pain in the wound. °· You have a red streak that extends from the wound. °· There is pus coming from the wound. °· You have a fever. °· You have shaking chills. °· There is a bad smell coming from the wound. °· You have persistent bleeding from the wound. °MAKE SURE YOU:  °· Understand these instructions. °· Will watch your condition. °· Will get help right away if you are not doing well or get worse. °Document Released: 08/02/2004 Document Revised: 09/17/2011 Document Reviewed: 10/29/2010 °ExitCare® Patient Information ©2015 [employer]. This information is not intended to replace advice given to you by your health care provider. Make sure you discuss any questions you have with your health care provider. ° °

## 2014-04-30 NOTE — Progress Notes (Signed)
Yes, code for size of the excision

## 2014-05-03 ENCOUNTER — Encounter: Payer: Self-pay | Admitting: Family Medicine

## 2014-05-10 ENCOUNTER — Encounter: Payer: Self-pay | Admitting: Family Medicine

## 2014-05-10 ENCOUNTER — Ambulatory Visit (INDEPENDENT_AMBULATORY_CARE_PROVIDER_SITE_OTHER): Payer: 59 | Admitting: Family Medicine

## 2014-05-10 VITALS — BP 138/80 | HR 76 | Ht 68.0 in | Wt 198.0 lb

## 2014-05-10 DIAGNOSIS — Z4802 Encounter for removal of sutures: Secondary | ICD-10-CM

## 2014-05-10 DIAGNOSIS — L910 Hypertrophic scar: Secondary | ICD-10-CM | POA: Insufficient documentation

## 2014-05-10 NOTE — Progress Notes (Signed)
Chief Complaint  Patient presents with  . Suture / Staple Removal    left arm. No problems.    She hasn't had any discomfort, crusting, itching, redness, swelling, drainage or any other complaints.  She has been working on getting blood pressure and weight down.  PHYSICAL EXAM: BP 138/80 mmHg  Pulse 76  Ht 5\' 8"  (1.727 m)  Wt 198 lb (89.812 kg)  BMI 30.11 kg/m2  LMP 04/14/2014  Well developed, pleasant female in no distress Left arm:  Wound edges are well approximated.  There is no erythema, drainage, swelling.  Sutures were removed without complication, and steri-strips were applied.  ASSESSMENT/PLAN:  Visit for suture removal  Keloid of skin  Reviewed dx of keloids (hers was mild)--doubt will need additional treatment (ie steroid injections), but return for treatment if noted.  Discussed normal scar healing expectations. Consider use of Mederma. Wound care reviewed in detail.  F/u prn

## 2014-05-10 NOTE — Progress Notes (Signed)
Post op code would probably be best, either is fine

## 2014-05-10 NOTE — Patient Instructions (Signed)
Suture Removal, Care After Refer to this sheet in the next few weeks. These instructions provide you with information on caring for yourself after your procedure. Your health care provider may also give you more specific instructions. Your treatment has been planned according to current medical practices, but problems sometimes occur. Call your health care provider if you have any problems or questions after your procedure. WHAT TO EXPECT AFTER THE PROCEDURE After your stitches (sutures) are removed, it is typical to have the following:  Some discomfort and swelling in the wound area.  Slight redness in the area. HOME CARE INSTRUCTIONS   If you have skin adhesive strips over the wound area, do not take the strips off. They will fall off on their own in a few days. If the strips remain in place after 14 days, you may remove them.  Change any bandages (dressings) at least once a day or as directed by your health care provider. If the bandage sticks, soak it off with warm, soapy water.  Apply cream or ointment only as directed by your health care provider. If using cream or ointment, wash the area with soap and water 2 times a day to remove all the cream or ointment. Rinse off the soap and pat the area dry with a clean towel.  Keep the wound area dry and clean. If the bandage becomes wet or dirty, or if it develops a bad smell, change it as soon as possible.  Continue to protect the wound from injury.  Use sunscreen when out in the sun. New scars become sunburned easily. SEEK MEDICAL CARE IF:  You have increasing redness, swelling, or pain in the wound.  You see pus coming from the wound.  You have a fever.  You notice a bad smell coming from the wound or dressing.  Your wound breaks open (edges not staying together). Document Released: 03/20/2001 Document Revised: 04/15/2013 Document Reviewed: 02/04/2013 [facility] Patient Information 2015 [place], [place]. This information is not  intended to replace advice given to you by your health care provider. Make sure you discuss any questions you have with your health care provider.   Consider using Mederma to help prevent scarring.

## 2014-05-11 ENCOUNTER — Encounter: Payer: Self-pay | Admitting: Family Medicine

## 2014-05-21 ENCOUNTER — Telehealth: Payer: Self-pay | Admitting: *Deleted

## 2014-05-21 ENCOUNTER — Other Ambulatory Visit (HOSPITAL_COMMUNITY)
Admission: RE | Admit: 2014-05-21 | Discharge: 2014-05-21 | Disposition: A | Discharge status: Home / Self Care | Payer: 59 | Source: Ambulatory Visit | Attending: Gynecology | Admitting: Gynecology

## 2014-05-21 ENCOUNTER — Telehealth: Payer: Self-pay | Admitting: Genetic Counselor

## 2014-05-21 ENCOUNTER — Ambulatory Visit (INDEPENDENT_AMBULATORY_CARE_PROVIDER_SITE_OTHER): Payer: 59 | Admitting: Gynecology

## 2014-05-21 ENCOUNTER — Encounter: Payer: Self-pay | Admitting: Gynecology

## 2014-05-21 VITALS — BP 136/90 | Ht 68.0 in | Wt 193.0 lb

## 2014-05-21 DIAGNOSIS — Z01419 Encounter for gynecological examination (general) (routine) without abnormal findings: Secondary | ICD-10-CM | POA: Insufficient documentation

## 2014-05-21 DIAGNOSIS — B001 Herpesviral vesicular dermatitis: Secondary | ICD-10-CM

## 2014-05-21 DIAGNOSIS — Z803 Family history of malignant neoplasm of breast: Secondary | ICD-10-CM

## 2014-05-21 MED ORDER — NORGESTIMATE-ETH ESTRADIOL 0.25-35 MG-MCG PO TABS: 1.0000 | ORAL_TABLET | Freq: Every day | ORAL | Status: DC

## 2014-05-21 NOTE — Telephone Encounter (Signed)
Referral faxed to [facility], they will contact pt to schedule 

## 2014-05-21 NOTE — Progress Notes (Addendum)
Tamara Spencer 07/10/1990 [id number]        23 y.o.  G0P0 for annual exam.  Several issues noted below.  Past medical history,surgical history, problem list, medications, allergies, family history and social history were all reviewed and documented as reviewed in the EPIC chart.  ROS:  12 system ROS performed with pertinent positives and negatives included in the history, assessment and plan.   Additional significant findings :  none   Exam: Tamara Spencer Vitals:   05/21/14 0832  BP: 136/90  Height: '5\' 8"'  (1.727 m)  Weight: 193 lb (87.544 kg)   General appearance:  Normal affect, orientation and appearance. Skin: Grossly normal HEENT: Without gross lesions.  No cervical or supraclavicular adenopathy. Thyroid normal.  Lungs:  Clear without wheezing, rales or rhonchi Cardiac: RR, without RMG Abdominal:  Soft, nontender, without masses, guarding, rebound, organomegaly or hernia Breasts:  Examined lying and sitting without masses, retractions, discharge or axillary adenopathy. Pelvic:  Ext/BUS/vagina normal  Cervix normal. Pap done  Uterus anteverted, normal size, shape and contour, midline and mobile nontender   Adnexa  Without masses or tenderness    Anus and perineum  Normal    Assessment/Plan:  23 y.o. G0P0 female for annual exam with regular menses, oral contraceptives.   1. Oral contraceptives. Patient wants to continue, doing well on these.  Does have a history of headaches which actually are better when she is on the oral contraceptives. I reviewed possible increased risk of stroke as well as thrombosis. Patient's comfortable continuing and I refilled her 1 year with Ortho-Cyclen equivalent. Blood pressure 136/90 today. Patient is being followed by her primary for this as it was apparently elevated at her office at a recent visit. They're actively working on exercise and diet and I encouraged her to do so. 2. Family history of breast cancer premenopausal and her mother  and maternal aunt. Her mother did undergo genetic testing and she brought a copy with her. Her mother was positive for ATM heterozygous and Bard1. There was no recommendation that Tamara Spencer be tested per her history. I'm going to review the genetic counselors consult with her mother to further clarify this to make sure Tamara Spencer does not need to be tested or more aggressively screened. Tamara Spencer knows to call me back in 2 weeks to follow up on this.  SBE monthly reviewed. 3. Pap smear 2012.  Pap smear done today. No history of significant abnormal Pap smears previously. 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 Uses Valtrex which has been prescribed by her primary physician. 5. Gardasil series received. 6. Health maintenance. No routine blood work done as she has this done at her primary physician's office. Follow up with me in 2 weeks for genetic counseling review. Otherwise annually.  Addendum: I reviewed the genetic counselors note in reference to the patient's mother and it did offer the option for genetic counseling and testing in family members. With the ATM mutation if Tamaria would carry this we would review starting screening mammograms and possible MRIs. We will contact Tamara Spencer and offer her a follow up appointment with a genetic counselor.   Tamara Auerbach MD, 9:06 AM 05/21/2014

## 2014-05-21 NOTE — Telephone Encounter (Signed)
-----  Message from Anastasio Auerbach, MD sent at 05/21/2014 10:32 AM EST ----- I routed a note to you in reference to this patient. Recommend she make an appointment to talk to a genetic oncology counselor in reference to mother positive for ATM mutation with breast cancer.

## 2014-05-21 NOTE — Patient Instructions (Signed)
You may obtain a copy of any labs that were done today by logging onto MyChart as outlined in the instructions provided with your AVS (after visit summary). The office will not call with normal lab results but certainly if there are any significant abnormalities then we will contact you.   Health Maintenance, Female A healthy lifestyle and preventative care can promote health and wellness.  Maintain regular health, dental, and eye exams.  Eat a healthy diet. Foods like vegetables, fruits, whole grains, low-fat dairy products, and lean protein foods contain the nutrients you need without too many calories. Decrease your intake of foods high in solid fats, added sugars, and salt. Get information about a proper diet from your caregiver, if necessary.  Regular physical exercise is one of the most important things you can do for your health. Most adults should get at least 150 minutes of moderate-intensity exercise (any activity that increases your heart rate and causes you to sweat) each week. In addition, most adults need muscle-strengthening exercises on 2 or more days a week.   Maintain a healthy weight. The body mass index (BMI) is a screening tool to identify possible weight problems. It provides an estimate of body fat based on height and weight. Your caregiver can help determine your BMI, and can help you achieve or maintain a healthy weight. For adults 20 years and older:  A BMI below 18.5 is considered underweight.  A BMI of 18.5 to 24.9 is normal.  A BMI of 25 to 29.9 is considered overweight.  A BMI of 30 and above is considered obese.  Maintain normal blood lipids and cholesterol by exercising and minimizing your intake of saturated fat. Eat a balanced diet with plenty of fruits and vegetables. Blood tests for lipids and cholesterol should begin at age 61 and be repeated every 5 years. If your lipid or cholesterol levels are high, you are over 50, or you are a high risk for heart  disease, you may need your cholesterol levels checked more frequently.Ongoing high lipid and cholesterol levels should be treated with medicines if diet and exercise are not effective.  If you smoke, find out from your caregiver how to quit. If you do not use tobacco, do not start.  Lung cancer screening is recommended for adults aged 33 80 years who are at high risk for developing lung cancer because of a history of smoking. Yearly low-dose computed tomography (CT) is recommended for people who have at least a 30-pack-year history of smoking and are a current smoker or have quit within the past 15 years. A pack year of smoking is smoking an average of 1 pack of cigarettes a day for 1 year (for example: 1 pack a day for 30 years or 2 packs a day for 15 years). Yearly screening should continue until the smoker has stopped smoking for at least 15 years. Yearly screening should also be stopped for people who develop a health problem that would prevent them from having lung cancer treatment.  If you are pregnant, do not drink alcohol. If you are breastfeeding, be very cautious about drinking alcohol. If you are not pregnant and choose to drink alcohol, do not exceed 1 drink per day. One drink is considered to be 12 ounces (355 mL) of beer, 5 ounces (148 mL) of wine, or 1.5 ounces (44 mL) of liquor.  Avoid use of street drugs. Do not share needles with anyone. Ask for help if you need support or instructions about stopping  the use of drugs.  High blood pressure causes heart disease and increases the risk of stroke. Blood pressure should be checked at least every 1 to 2 years. Ongoing high blood pressure should be treated with medicines, if weight loss and exercise are not effective.  If you are 59 to 25 years old, ask your caregiver if you should take aspirin to prevent strokes.  Diabetes screening involves taking a blood sample to check your fasting blood sugar level. This should be done once every 3  years, after age 91, if you are within normal weight and without risk factors for diabetes. Testing should be considered at a younger age or be carried out more frequently if you are overweight and have at least 1 risk factor for diabetes.  Breast cancer screening is essential preventative care for women. You should practice "breast self-awareness." This means understanding the normal appearance and feel of your breasts and may include breast self-examination. Any changes detected, no matter how small, should be reported to a caregiver. Women in their 66s and 30s should have a clinical breast exam (CBE) by a caregiver as part of a regular health exam every 1 to 3 years. After age 101, women should have a CBE every year. Starting at age 100, women should consider having a mammogram (breast X-ray) every year. Women who have a family history of breast cancer should talk to their caregiver about genetic screening. Women at a high risk of breast cancer should talk to their caregiver about having an MRI and a mammogram every year.  Breast cancer gene (BRCA)-related cancer risk assessment is recommended for women who have family members with BRCA-related cancers. BRCA-related cancers include breast, ovarian, tubal, and peritoneal cancers. Having family members with these cancers may be associated with an increased risk for harmful changes (mutations) in the breast cancer genes BRCA1 and BRCA2. Results of the assessment will determine the need for genetic counseling and BRCA1 and BRCA2 testing.  The Pap test is a screening test for cervical cancer. Women should have a Pap test starting at age 57. Between ages 25 and 35, Pap tests should be repeated every 2 years. Beginning at age 37, you should have a Pap test every 3 years as long as the past 3 Pap tests have been normal. If you had a hysterectomy for a problem that was not cancer or a condition that could lead to cancer, then you no longer need Pap tests. If you are  between ages 50 and 76, and you have had normal Pap tests going back 10 years, you no longer need Pap tests. If you have had past treatment for cervical cancer or a condition that could lead to cancer, you need Pap tests and screening for cancer for at least 20 years after your treatment. If Pap tests have been discontinued, risk factors (such as a new sexual partner) need to be reassessed to determine if screening should be resumed. Some women have medical problems that increase the chance of getting cervical cancer. In these cases, your caregiver may recommend more frequent screening and Pap tests.  The human papillomavirus (HPV) test is an additional test that may be used for cervical cancer screening. The HPV test looks for the virus that can cause the cell changes on the cervix. The cells collected during the Pap test can be tested for HPV. The HPV test could be used to screen women aged 44 years and older, and should be used in women of any age  who have unclear Pap test results. After the age of 55, women should have HPV testing at the same frequency as a Pap test.  Colorectal cancer can be detected and often prevented. Most routine colorectal cancer screening begins at the age of 44 and continues through age 20. However, your caregiver may recommend screening at an earlier age if you have risk factors for colon cancer. On a yearly basis, your caregiver may provide home test kits to check for hidden blood in the stool. Use of a small camera at the end of a tube, to directly examine the colon (sigmoidoscopy or colonoscopy), can detect the earliest forms of colorectal cancer. Talk to your caregiver about this at age 86, when routine screening begins. Direct examination of the colon should be repeated every 5 to 10 years through age 13, unless early forms of pre-cancerous polyps or small growths are found.  Hepatitis C blood testing is recommended for all people born from [birth year] through 1967 and any  individual with known risks for hepatitis C.  Practice safe sex. Use condoms and avoid high-risk sexual practices to reduce the spread of sexually transmitted infections (STIs). Sexually active women aged 36 and younger should be checked for Chlamydia, which is a common sexually transmitted infection. Older women with new or multiple partners should also be tested for Chlamydia. Testing for other STIs is recommended if you are sexually active and at increased risk.  Osteoporosis is a disease in which the bones lose minerals and strength with aging. This can result in serious bone fractures. The risk of osteoporosis can be identified using a bone density scan. Women ages 20 and over and women at risk for fractures or osteoporosis should discuss screening with their caregivers. Ask your caregiver whether you should be taking a calcium supplement or vitamin D to reduce the rate of osteoporosis.  Menopause can be associated with physical symptoms and risks. Hormone replacement therapy is available to decrease symptoms and risks. You should talk to your caregiver about whether hormone replacement therapy is right for you.  Use sunscreen. Apply sunscreen liberally and repeatedly throughout the day. You should seek shade when your shadow is shorter than you. Protect yourself by wearing long sleeves, pants, a wide-brimmed hat, and sunglasses year round, whenever you are outdoors.  Notify your caregiver of new moles or changes in moles, especially if there is a change in shape or color. Also notify your caregiver if a mole is larger than the size of a pencil eraser.  Stay current with your immunizations. Document Released: 01/08/2011 Document Revised: 10/20/2012 Document Reviewed: 01/08/2011 [facility] Patient Information 2014 [employer].

## 2014-05-21 NOTE — Telephone Encounter (Signed)
LEFT MESSAGE FOR PATIENT TO RETURN CALL TO SCHEDULE GENETIC APPT.  °

## 2014-05-21 NOTE — Addendum Note (Signed)
Addended by: Nelva Nay on: 05/21/2014 09:18 AM   Modules accepted: Orders, SmartSet

## 2014-05-24 LAB — CYTOLOGY - PAP

## 2014-05-25 ENCOUNTER — Telehealth: Payer: Self-pay | Admitting: Genetic Counselor

## 2014-05-25 NOTE — Telephone Encounter (Signed)
S/W PATIENT AND GAVE GENETIC APPT FOR 11/23 @ 10 W/KAREN POWELL.  [place] PACKET MAILED.

## 2014-05-27 ENCOUNTER — Telehealth: Payer: Self-pay | Admitting: Genetic Counselor

## 2014-05-27 NOTE — Telephone Encounter (Signed)
LM for Tamara Spencer on her mobile phone, but caught her at work.  She will bring in a copy of her mother's genetic testing so that we can discuss the result and do testing.

## 2014-05-31 ENCOUNTER — Ambulatory Visit (HOSPITAL_BASED_OUTPATIENT_CLINIC_OR_DEPARTMENT_OTHER): Payer: 59 | Admitting: Genetic Counselor

## 2014-05-31 ENCOUNTER — Encounter: Payer: Self-pay | Admitting: Genetic Counselor

## 2014-05-31 ENCOUNTER — Other Ambulatory Visit: Payer: 59

## 2014-05-31 DIAGNOSIS — Z315 Encounter for genetic counseling: Secondary | ICD-10-CM

## 2014-05-31 DIAGNOSIS — Z806 Family history of leukemia: Secondary | ICD-10-CM

## 2014-05-31 DIAGNOSIS — Z803 Family history of malignant neoplasm of breast: Secondary | ICD-10-CM

## 2014-05-31 NOTE — Progress Notes (Signed)
Dr.  Donalynn Furlong requested a consultation for genetic counseling and risk assessment for Tamara Spencer, a 23 y.o. female, for discussion of her family history of breast cancer and known family ATM mutation. In March 2015, Tamara Spencer mother underwent genetic testing based on her personal history of premenopausal breast cancer and family history of breast cancer and leukemia.  She had genetic testing through [facility] with the Breast/Ovarian Cancer panel which found an ATM pathogenic mutation and a BARD1 VUS.  Tamara Spencer brings this report with her, and is wondering if the ATM mutation is something that she needs to be tested for. Tamara Spencer is getting married in April 2016, and is planning children in the future. She presents to clinic today to discuss the possibility of a genetic predisposition to cancer, and to further clarify her risks, as well as her family members' risks for cancer.   HISTORY OF PRESENT ILLNESS: Tamara Spencer is a 23 y.o. female with no personal history of cancer.  Her mother had premenopausal breast cancer and was found to have an ATM mutation and BARD1 VUS.  Tamara Spencer has not had a mammogram or colonoscopy.  Past Medical History  Diagnosis Date  . Recurrent cold sores   . Headache     monthly; less severe since on new OCP    Past Surgical History  Procedure Laterality Date  . Wisdom tooth extraction    . Keloid excision      History   Social History  . Marital Status: Single    Spouse Name: N/A    Number of Children: 0  . Years of Education: N/A   Occupational History  . IT security team [employer]   Social History Main Topics  . Smoking status: Never Smoker   . Smokeless tobacco: Never Used  . Alcohol Use: No  . Drug Use: No  . Sexual Activity:    Partners: Male    Birth Control/ Protection: Condom, Pill   Other Topics Concern  . None   Social History Narrative   Lives with her parents, dog.  Engaged to be married (10/2014).     REPRODUCTIVE HISTORY AND PERSONAL RISK ASSESSMENT FACTORS: Menarche was at age 15-12.   premenopausal Uterus Intact: yes Ovaries Intact: yes [id number], first live birth at age N/A  She has not previously undergone treatment for infertility.   Oral Contraceptive use: 3 years   She has not used HRT in the past.    FAMILY HISTORY:  We obtained a detailed, 4-generation family history.  Significant diagnoses are listed below: Family History  Problem Relation Age of Onset  . Diabetes Father   . Hypertension Father   . Hyperlipidemia Father   . Diabetes Paternal Grandfather   . Breast cancer Mother 52    BRCA gene testing negative in 2015; positive for ATM mutation  . Other Mother     Myleofibrosis  . Heart disease Neg Hx   . Breast cancer Other     dx in her 72s  . Leukemia Cousin 14    mother's maternal first cousin  . Breast cancer Cousin     mother's materanl first cousin dx in her 18s   Tamara Spencer is an only child.    Patient's maternal ancestors are of Caucasian descent, and paternal ancestors are of Caucasian descent. There is no reported Ashkenazi Jewish ancestry. There is no known consanguinity.  GENETIC COUNSELING ASSESSMENT: Tamara Spencer is a 23 y.o. 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 We, therefore, discussed and recommended the following at today's visit.   DISCUSSION: We reviewed the characteristics, features and inheritance patterns of hereditary cancer syndromes. We also discussed genetic testing, including the appropriate family members to test, the process of testing, insurance coverage and turn-around-time for results.   Studies of these families demonstrated increased incident of breast cancer in the mothers (who are heterozygous carriers) of the affected children, thus prompting further evaluation of the  relationship between breast cancer and ATM.  Women who are heterozygous ATM carriers have an increase breast cancer risk.  They have 5-fold increased risk of breast cancer <50 years, and 2-3 fold increased risk for breast cancer overall.  In families with familial breast cancer that were negative for BRCA1 or BRCA2 genes, approximately 2.7% of women were found to have one ATM mutation.  In families with both breast cancer and leukemia, 6.7% of women were found to have one ATM mutation.  PLAN: After considering the risks, benefits, and limitations, Tamara Spencer provided informed consent to pursue genetic testing and the blood sample will be sent to [facility] for analysis of the ATM targeted mutation analysis. We discussed the implications of a positive, negative and/ or variant of uncertain significance genetic test result. Results should be available within approximately ~2 weeks' time, at which point they will be disclosed by telephone to [facility], as will any additional recommendations warranted by these results. Tamara Spencer will receive a summary of her genetic counseling visit and a copy of her results once available. This information will also be available in Epic. We encouraged Tamara Spencer to remain in contact with cancer genetics annually so that we can continuously update the family history and inform her of any changes in cancer genetics and testing that may be of benefit for her family. Tamara Spencer questions were answered to her satisfaction today. Our contact information was provided should additional questions or concerns arise.  The patient was seen for a total of 30 minutes, greater than 50% of which was spent face-to-face counseling.  This note will also be sent to the referring provider via the electronic medical record. The patient will be supplied with a summary of this genetic counseling discussion as well as educational information on the discussed hereditary  cancer syndromes following the conclusion of their visit.   This plan was discussed with Drs. Magrinat, Lindi Adie and [place] and they agree with this assessment.  _______________________________________________________________________ For Office Staff:  Number of people involved in session: 1 Was an Intern/ student involved with case: no

## 2014-06-11 NOTE — Telephone Encounter (Signed)
Pt had appointment on 05/31/14

## 2014-06-14 ENCOUNTER — Telehealth: Payer: Self-pay | Admitting: Genetic Counselor

## 2014-06-14 ENCOUNTER — Encounter: Payer: Self-pay | Admitting: Genetic Counselor

## 2014-06-14 DIAGNOSIS — Z1379 Encounter for other screening for genetic and chromosomal anomalies: Secondary | ICD-10-CM | POA: Insufficient documentation

## 2014-06-14 NOTE — Telephone Encounter (Signed)
Revealed negative genetic testing for the known familial mutation in ATM.

## 2014-06-15 ENCOUNTER — Encounter: Payer: Self-pay | Admitting: Genetic Counselor

## 2014-06-15 ENCOUNTER — Telehealth: Payer: Self-pay | Admitting: Genetic Counselor

## 2014-06-15 NOTE — Progress Notes (Signed)
HPI: Ms. Dorothyann Peng was previously seen in the [facility] due to a family history of cancer and known family mutation in ATM. Please refer to our prior cancer genetics clinic note for more information regarding Ms. Stanley's medical, social and family histories, and our assessment and recommendations, at the time. Ms. Catha Gosselin recent genetic test results were disclosed to her, as were recommendations warranted by these results. These results and recommendations are discussed in more detail below.  GENETIC TEST RESULTS: At the time of Ms. Stanley's visit, we recommended she pursue genetic testing for the known family mutation in ATM. This test, which included sequencing of the following gene:  ATM.  The report date is June 12, 2014.  Testing was performed at [facility] Genetic testing was normal, and did not reveal the known family mutation. The test report has been scanned into EPIC and is located under the Media tab.   We discussed with Ms. Dorothyann Peng that since the current genetic testing is not perfect, it is possible there may be a gene mutation in one of these genes that current testing cannot detect, but that chance is small. We also discussed, that it is possible that another gene that has not yet been discovered, or that we have not yet tested, is responsible for the cancer diagnoses in the family, and it is, therefore, important to remain in touch with cancer genetics in the future so that we can continue to offer Ms. Dorothyann Peng the most up to date genetic testing.   CANCER SCREENING RECOMMENDATIONS: This result is reassuring and suggests that Ms. Stanley's cancer was most likely not due to an inherited predisposition associated with one of these genes. Most cancers happen by chance and this negative test, along with details of her family history, suggests that her cancer falls into this category. We, therefore, recommended she continue to follow the cancer  management and screening guidelines provided by her oncology and primary providers.   RECOMMENDATIONS FOR FAMILY MEMBERS: Women in this family might be at some increased risk of developing cancer, over the general population risk, simply due to the family history of cancer. We recommended women in this family have a yearly mammogram beginning at age 85, an an annual clinical breast exam, and perform monthly breast self-exams. Women in this family should also have a gynecological exam as recommended by their primary provider. All family members should have a colonoscopy by age 79.  FOLLOW-UP: Lastly, we discussed with Ms. Dorothyann Peng that cancer genetics is a rapidly advancing field and it is possible that new genetic tests will be appropriate for her and/or her family members in the future. We encouraged her to remain in contact with cancer genetics on an annual basis so we can update her personal and family histories and let her know of advances in cancer genetics that may benefit this family.   Our contact number was provided. Ms. Catha Gosselin questions were answered to her satisfaction, and she knows she is welcome to call us at anytime with additional questions or concerns.   Roma Kayser, MS, [facility] Certified Genetic Counselor [email or web address]

## 2014-06-15 NOTE — Telephone Encounter (Signed)
error 

## 2014-06-23 ENCOUNTER — Encounter: Payer: Self-pay | Admitting: Gynecology

## 2014-06-23 ENCOUNTER — Telehealth: Payer: Self-pay | Admitting: Gynecology

## 2014-06-23 NOTE — Telephone Encounter (Signed)
Tell patient I reviewed the oncology genetic report from her mother. They stated 50% of first-degree relatives test positive for the mutations. If you are positive you have a higher risk of breast cancer and this would warrant more intensive screening such as annual MRI and annual mammography. Would also bring up the issue about ultimate prophylactic mastectomy down the road. I would recommend that the patient make an appointment with a genetic counselor, possibly the same counselor that spoke with her mother and let them discuss this with her and consider testing.

## 2014-06-24 ENCOUNTER — Encounter: Payer: Self-pay | Admitting: Gynecology

## 2014-06-24 NOTE — Telephone Encounter (Signed)
Left message for pt to call.

## 2014-06-24 NOTE — Telephone Encounter (Signed)
Pt informed with the below note, she actually show the genetic counselor this month notes in epic.

## 2014-08-24 ENCOUNTER — Encounter: Payer: Self-pay | Admitting: Gynecology

## 2014-08-24 NOTE — Telephone Encounter (Signed)
There is no vaccine or prophylactic medication that you can take for the Zika virus. The CDC recommendation is to avoid exposure and endemic areas while pregnant or contemplating pregnancy soon. My recommendation is if they go to these areas then do not plan on trying to get pregnant right away. Report any rashes or flulike symptoms during or following the vacation for several weeks. I am unclear as to how long the CDC recommends waiting on pregnancy following a trip and we can relook at that when they return. But the bottom line is I would not pursue pregnancy immediately following the trip until we are sure that you do not have any viral like symptoms.

## 2014-09-14 ENCOUNTER — Telehealth: Payer: 59 | Admitting: Nurse Practitioner

## 2014-09-14 DIAGNOSIS — J029 Acute pharyngitis, unspecified: Secondary | ICD-10-CM

## 2014-09-14 MED ORDER — AMOXICILLIN 875 MG PO TABS: 875.0000 mg | ORAL_TABLET | Freq: Two times a day (BID) | ORAL | Status: DC

## 2014-09-14 NOTE — Progress Notes (Signed)
We are sorry that you are not feeling well.  Here is how we plan to help!  Based on what you have shared with me it looks like you have pharyngitis.  pharyngitis is inflammation and infection in the throat.  Based on your presentation I believe you most likely have Acute Bacterial sinusitis.  This is an infection caused by bacteria and is treated with antibiotics.  I have prescribed Amoxicillin, an antibiotic in the penicillin family, one tablet twice daily with food, for 7 days.. You may use an oral decongestant such as Mucinex D or if you have glaucoma or high blood pressure use plain Mucinex.  Saline nasal sprays help and can safely be used as often as needed for congestion. If you develop worsening sinus pain, fever or notice severe headache and vision changes, or if symptoms are not better after completion of antibiotic, please schedule an appointment with a health care provider.  Sinus infections are not as easily transmitted as other respiratory infection, however we still recommend that you avoid close contact with loved ones, especially the very young and elderly.  Remember to wash your hands thoroughly throughout the day as this is the number one way to prevent the spread of infection!  Home Care:  Only take medications as instructed by your medical team.  Complete the entire course of an antibiotic.  Do not take these medications with alcohol.  A steam or ultrasonic humidifier can help congestion.  You can place a towel over your head and breathe in the steam from hot water coming from a faucet.  Avoid close contacts especially the very young and the elderly.  Cover your mouth when you cough or sneeze.  Always remember to wash your hands.  Get Help Right Away If:  You develop worsening fever or sinus pain.  You develop a severe head ache or visual changes.  Your symptoms persist after you have completed your treatment plan.  Make sure you  Understand these  instructions.  Will watch your condition.  Will get help right away if you are not doing well or get worse.  Your e-visit answers were reviewed by a board certified advanced clinical practitioner to complete your personal care plan.  Depending on the condition, your plan could have included both over the counter or prescription medications.  If there is a problem please reply  once you have received a response from your provider.  Your safety is important to [place].  If you have drug allergies check your prescription carefully.    You can use MyChart to ask questions about today's visit, request a non-urgent call back, or ask for a work or school excuse.  You will get an e-mail in the next two days asking about your experience.  I hope that your e-visit has been valuable and will speed your recovery. Thank you for using e-visits.

## 2014-11-23 ENCOUNTER — Ambulatory Visit (INDEPENDENT_AMBULATORY_CARE_PROVIDER_SITE_OTHER): Payer: 59 | Admitting: Gynecology

## 2014-11-23 ENCOUNTER — Encounter: Payer: Self-pay | Admitting: Gynecology

## 2014-11-23 VITALS — BP 118/76

## 2014-11-23 DIAGNOSIS — Z3200 Encounter for pregnancy test, result unknown: Secondary | ICD-10-CM

## 2014-11-23 LAB — PREGNANCY, URINE: PREG TEST UR: POSITIVE

## 2014-11-23 NOTE — Progress Notes (Signed)
Tamara Spencer Feb 12, 1991 [id number]        24 y.o.  G0P0 presents for pregnancy confirmation. LMP 19 April, trying to get pregnant on preconceptual prenatal vitamins. No bleeding or significant cramping.  Past medical history,surgical history, problem list, medications, allergies, family history and social history were all reviewed and documented in the EPIC chart.  Directed ROS with pertinent positives and negatives documented in the history of present illness/assessment and plan.  Exam: Kim assistant Filed Vitals:   11/23/14 1145  BP: 118/76   General appearance:  Normal Abdomen soft nontender without masses guarding rebound Pelvic external BUS vagina normal. Cervix grossly normal.  Uterus grossly normal size soft nontender. Adnexa without masses or tenderness.  Assessment/Plan:  24 y.o. G0P0 with early IUP approximate 4 weeks. I reviewed general first trimester obstetrical counseling. Uses Valtrex intermittently for cold sores and I recommend that she discontinue this.  Continue on her prenatal vitamins.  Patient understands that we do not do obstetrics and she will need to make an appointment to see an obstetrician for ongoing care over the next 4-6 weeks. I did offer a viability ultrasound at 6 weeks if she would prefer to do this before transferring this to validate a viable IUP but given she is doing no bleeding or other symptoms that if she wanted to wait and just follow up with them I am okay with this also. She is going to decide if she does decide on ultrasound she'll call back to schedule otherwise she will follow up with an obstetrician in [place].    Anastasio Auerbach MD, 12:01 PM 11/23/2014

## 2014-11-23 NOTE — Patient Instructions (Addendum)
Make an appointment with an obstetrician group in [facility] for continuing obstetrical care.

## 2014-11-23 NOTE — Addendum Note (Signed)
Addended by: Nelva Nay on: 11/23/2014 12:07 PM   Modules accepted: Orders

## 2014-12-23 LAB — OB RESULTS CONSOLE RPR: RPR: NONREACTIVE

## 2014-12-23 LAB — OB RESULTS CONSOLE GC/CHLAMYDIA
CHLAMYDIA, DNA PROBE: NEGATIVE
Gonorrhea: NEGATIVE

## 2014-12-23 LAB — OB RESULTS CONSOLE ANTIBODY SCREEN: Antibody Screen: NEGATIVE

## 2014-12-23 LAB — OB RESULTS CONSOLE HEPATITIS B SURFACE ANTIGEN: HEP B S AG: NEGATIVE

## 2014-12-23 LAB — OB RESULTS CONSOLE RUBELLA ANTIBODY, IGM: Rubella: IMMUNE

## 2014-12-23 LAB — OB RESULTS CONSOLE ABO/RH: RH TYPE: POSITIVE

## 2014-12-23 LAB — OB RESULTS CONSOLE HIV ANTIBODY (ROUTINE TESTING): HIV: NONREACTIVE

## 2015-01-11 ENCOUNTER — Other Ambulatory Visit: Payer: Self-pay | Admitting: Obstetrics and Gynecology

## 2015-01-12 LAB — CYTOLOGY - PAP

## 2015-05-23 ENCOUNTER — Encounter: Payer: Self-pay | Admitting: Family Medicine

## 2015-05-23 ENCOUNTER — Encounter: Payer: 59 | Admitting: Gynecology

## 2015-07-10 NOTE — L&D Delivery Note (Signed)
Operative Delivery Note At 4:13 AM a viable female was delivered via Vaginal, Vacuum Neurosurgeon).  Presentation: vertex; Position: Occiput,, Anterior; Station: +2.  Verbal consent: obtained from patient.  Risks and benefits discussed in detail.  Risks include, but are not limited to the risks of anesthesia, bleeding, infection, damage to maternal tissues, fetal cephalhematoma.  There is also the risk of inability to effect vaginal delivery of the head, or shoulder dystocia that cannot be resolved by established maneuvers, leading to the need for emergency cesarean section.  APGAR: 9 9 weight pending .   Placenta status: Intact, Spontaneous.   Cord: 3 vessels with the following complications: None.  Cord pH: not obtained  Anesthesia: Epidural  Instruments: mushroom with 5 pulls no popoffs Episiotomy: Right Mediolateral Lacerations:   Suture Repair: 3.0 chromic Est. Blood Loss (mL):  300  Mom to postpartum.  Baby to Couplet care / Skin to Skin.  Charnee Turnipseed L 08/07/2015, 4:25 AM

## 2015-07-12 DIAGNOSIS — Z36 Encounter for antenatal screening of mother: Secondary | ICD-10-CM | POA: Diagnosis not present

## 2015-08-01 DIAGNOSIS — O26893 Other specified pregnancy related conditions, third trimester: Secondary | ICD-10-CM | POA: Diagnosis not present

## 2015-08-01 DIAGNOSIS — Z3403 Encounter for supervision of normal first pregnancy, third trimester: Secondary | ICD-10-CM | POA: Diagnosis not present

## 2015-08-01 DIAGNOSIS — Z3A39 39 weeks gestation of pregnancy: Secondary | ICD-10-CM | POA: Diagnosis not present

## 2015-08-05 ENCOUNTER — Encounter (HOSPITAL_COMMUNITY): Payer: Self-pay | Admitting: *Deleted

## 2015-08-05 ENCOUNTER — Telehealth (HOSPITAL_COMMUNITY): Payer: Self-pay | Admitting: *Deleted

## 2015-08-05 LAB — OB RESULTS CONSOLE GBS: GBS: NEGATIVE

## 2015-08-05 NOTE — Telephone Encounter (Signed)
Preadmission screen  

## 2015-08-06 ENCOUNTER — Inpatient Hospital Stay (HOSPITAL_COMMUNITY): Payer: 59 | Admitting: Anesthesiology

## 2015-08-06 ENCOUNTER — Encounter (HOSPITAL_COMMUNITY): Payer: Self-pay | Admitting: *Deleted

## 2015-08-06 ENCOUNTER — Inpatient Hospital Stay (HOSPITAL_COMMUNITY)
Admission: AD | Admit: 2015-08-06 | Discharge: 2015-08-09 | DRG: 775 | Disposition: A | Discharge status: Home / Self Care | Payer: 59 | Source: Ambulatory Visit | Attending: Obstetrics and Gynecology | Admitting: Obstetrics and Gynecology

## 2015-08-06 DIAGNOSIS — Z3A40 40 weeks gestation of pregnancy: Secondary | ICD-10-CM | POA: Diagnosis not present

## 2015-08-06 DIAGNOSIS — R000 Tachycardia, unspecified: Secondary | ICD-10-CM | POA: Diagnosis not present

## 2015-08-06 DIAGNOSIS — O26893 Other specified pregnancy related conditions, third trimester: Principal | ICD-10-CM | POA: Diagnosis present

## 2015-08-06 LAB — COMPREHENSIVE METABOLIC PANEL
ALK PHOS: 132 U/L — AB (ref 38–126)
ALT: 14 U/L (ref 14–54)
AST: 24 U/L (ref 15–41)
Albumin: 3.1 g/dL — ABNORMAL LOW (ref 3.5–5.0)
Anion gap: 10 (ref 5–15)
BUN: 11 mg/dL (ref 6–20)
CALCIUM: 9.3 mg/dL (ref 8.9–10.3)
CO2: 19 mmol/L — AB (ref 22–32)
CREATININE: 0.58 mg/dL (ref 0.44–1.00)
Chloride: 108 mmol/L (ref 101–111)
Glucose, Bld: 90 mg/dL (ref 65–99)
Potassium: 3.7 mmol/L (ref 3.5–5.1)
SODIUM: 137 mmol/L (ref 135–145)
Total Bilirubin: 0.3 mg/dL (ref 0.3–1.2)
Total Protein: 6.5 g/dL (ref 6.5–8.1)

## 2015-08-06 LAB — URINALYSIS, ROUTINE W REFLEX MICROSCOPIC
Bilirubin Urine: NEGATIVE
Glucose, UA: NEGATIVE mg/dL
Ketones, ur: NEGATIVE mg/dL
LEUKOCYTES UA: NEGATIVE
NITRITE: NEGATIVE
PROTEIN: NEGATIVE mg/dL
Specific Gravity, Urine: 1.02 (ref 1.005–1.030)
pH: 6 (ref 5.0–8.0)

## 2015-08-06 LAB — CBC
HEMATOCRIT: 36.1 % (ref 36.0–46.0)
HEMOGLOBIN: 12.3 g/dL (ref 12.0–15.0)
MCH: 28 pg (ref 26.0–34.0)
MCHC: 34.1 g/dL (ref 30.0–36.0)
MCV: 82.2 fL (ref 78.0–100.0)
Platelets: 237 10*3/uL (ref 150–400)
RBC: 4.39 MIL/uL (ref 3.87–5.11)
RDW: 14.3 % (ref 11.5–15.5)
WBC: 13 10*3/uL — AB (ref 4.0–10.5)

## 2015-08-06 LAB — URINE MICROSCOPIC-ADD ON

## 2015-08-06 LAB — PROTEIN / CREATININE RATIO, URINE
Creatinine, Urine: 86 mg/dL
Protein Creatinine Ratio: 0.19 mg/mg{Cre} — ABNORMAL HIGH (ref 0.00–0.15)
Total Protein, Urine: 16 mg/dL

## 2015-08-06 LAB — URIC ACID: URIC ACID, SERUM: 4.4 mg/dL (ref 2.3–6.6)

## 2015-08-06 LAB — LACTATE DEHYDROGENASE: LDH: 145 U/L (ref 98–192)

## 2015-08-06 LAB — TYPE AND SCREEN
ABO/RH(D): A POS
Antibody Screen: NEGATIVE

## 2015-08-06 MED ORDER — OXYCODONE-ACETAMINOPHEN 5-325 MG PO TABS: 2.0000 | ORAL_TABLET | ORAL | Status: DC | PRN

## 2015-08-06 MED ORDER — LACTATED RINGERS IV SOLN
500.0000 mL | INTRAVENOUS | Status: DC | PRN
  Administered 2015-08-07: 500 mL via INTRAVENOUS

## 2015-08-06 MED ORDER — LIDOCAINE HCL (PF) 1 % IJ SOLN
30.0000 mL | INTRAMUSCULAR | Status: DC | PRN
  Filled 2015-08-06: qty 30

## 2015-08-06 MED ORDER — OXYTOCIN BOLUS FROM INFUSION
500.0000 mL | INTRAVENOUS | Status: DC
  Administered 2015-08-07: 500 mL via INTRAVENOUS

## 2015-08-06 MED ORDER — OXYTOCIN 10 UNIT/ML IJ SOLN: 1.0000 m[IU]/min | INTRAMUSCULAR | Status: DC

## 2015-08-06 MED ORDER — ACETAMINOPHEN 325 MG PO TABS: 650.0000 mg | ORAL_TABLET | ORAL | Status: DC | PRN

## 2015-08-06 MED ORDER — PHENYLEPHRINE 40 MCG/ML (10ML) SYRINGE FOR IV PUSH (FOR BLOOD PRESSURE SUPPORT)
80.0000 ug | PREFILLED_SYRINGE | INTRAVENOUS | Status: DC | PRN
  Filled 2015-08-06: qty 2
  Filled 2015-08-06: qty 20

## 2015-08-06 MED ORDER — TERBUTALINE SULFATE 1 MG/ML IJ SOLN
0.2500 mg | Freq: Once | INTRAMUSCULAR | Status: DC | PRN
  Filled 2015-08-06: qty 1

## 2015-08-06 MED ORDER — OXYTOCIN 10 UNIT/ML IJ SOLN
2.5000 [IU]/h | INTRAVENOUS | Status: DC
  Filled 2015-08-06: qty 4

## 2015-08-06 MED ORDER — DIPHENHYDRAMINE HCL 50 MG/ML IJ SOLN: 12.5000 mg | INTRAMUSCULAR | Status: DC | PRN

## 2015-08-06 MED ORDER — FENTANYL 2.5 MCG/ML BUPIVACAINE 1/10 % EPIDURAL INFUSION (WH - ANES)
14.0000 mL/h | INTRAMUSCULAR | Status: DC | PRN
  Administered 2015-08-06 – 2015-08-07 (×2): 14 mL/h via EPIDURAL
  Filled 2015-08-06 (×2): qty 125

## 2015-08-06 MED ORDER — CITRIC ACID-SODIUM CITRATE 334-500 MG/5ML PO SOLN: 30.0000 mL | ORAL | Status: DC | PRN

## 2015-08-06 MED ORDER — EPHEDRINE 5 MG/ML INJ
10.0000 mg | INTRAVENOUS | Status: DC | PRN
  Filled 2015-08-06: qty 2

## 2015-08-06 MED ORDER — LACTATED RINGERS IV SOLN
INTRAVENOUS | Status: DC
  Administered 2015-08-06 – 2015-08-07 (×2): via INTRAVENOUS

## 2015-08-06 MED ORDER — LIDOCAINE HCL (PF) 1 % IJ SOLN
INTRAMUSCULAR | Status: DC | PRN
  Administered 2015-08-06 (×2): 4 mL

## 2015-08-06 MED ORDER — OXYCODONE-ACETAMINOPHEN 5-325 MG PO TABS
1.0000 | ORAL_TABLET | ORAL | Status: DC | PRN
  Administered 2015-08-07: 1 via ORAL
  Filled 2015-08-06: qty 1

## 2015-08-06 MED ORDER — ONDANSETRON HCL 4 MG/2ML IJ SOLN: 4.0000 mg | Freq: Four times a day (QID) | INTRAMUSCULAR | Status: DC | PRN

## 2015-08-06 MED ORDER — FLEET ENEMA 7-19 GM/118ML RE ENEM: 1.0000 | ENEMA | RECTAL | Status: DC | PRN

## 2015-08-06 NOTE — H&P (Signed)
Tamara Spencer is a 25 y.o. G 1 P 0 at 40 w 4 days presents with contractions was 4 to 5 cm in triage. Maternal Medical History:  Reason for admission: Contractions.     OB History    Gravida Para Term Preterm AB TAB SAB Ectopic Multiple Living   1              Past Medical History  Diagnosis Date  . Recurrent cold sores   . Headache     monthly; less severe since on new OCP  . 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  Patient Negative   Past Surgical History  Procedure Laterality Date  . Wisdom tooth extraction    . Keloid excision     Family History: family history includes Breast cancer in her cousin and other; Breast cancer (age of onset: 2) in her mother; Diabetes in her father and paternal grandfather; Hyperlipidemia in her father; Hypertension in her father; Leukemia (age of onset: 57) in her cousin; Other in her mother. There is no history of Heart disease. Social History:  reports that she has never smoked. She has never used smokeless tobacco. She reports that she does not drink alcohol or use illicit drugs.   Prenatal Transfer Tool  Maternal Diabetes: No Genetic Screening: Normal Maternal Ultrasounds/Referrals: Normal Fetal Ultrasounds or other Referrals:  None Maternal Substance Abuse:  No Significant Maternal Medications:  None Significant Maternal Lab Results:  None Other Comments:  None  Review of Systems  All other systems reviewed and are negative.   Dilation: 4.5 Effacement (%): 90 Station: -2 Exam by:: A. Gagliardo, RN Blood pressure 148/95, pulse 112, temperature 98.5 F (36.9 C), temperature source Oral, resp. rate 18, height '5\' 9"'  (1.753 m), weight 234 lb (106.142 kg), last menstrual period 10/26/2014. Maternal Exam:  Abdomen: Fetal presentation: vertex     Fetal Exam Fetal State Assessment: Category I - tracings are normal.     Physical Exam  Nursing note and vitals reviewed. Constitutional: She  appears well-developed and well-nourished.  HENT:  Head: Normocephalic.  Eyes: Pupils are equal, round, and reactive to light.  Neck: Normal range of motion.  Cardiovascular: Normal rate and regular rhythm.     Prenatal labs: ABO, Rh: --/--/A POS (01/28 1810) Antibody: NEG (01/28 1810) Rubella: Immune (06/16 0000) RPR: Nonreactive (06/16 0000)  HBsAg: Negative (06/16 0000)  HIV: Non-reactive (06/16 0000)  GBS: Negative (01/27 0000)   Assessment/Plan: IUP at 40 w 4 days Labor Anticipate NSVD Epidural    Asencion Loveday L 08/06/2015, 7:35 PM

## 2015-08-06 NOTE — MAU Note (Signed)
Regular contractions since lunch 3 to 7 minutes apart, 40 to 55 seconds, on Monday 3/90, induction on Tuesday, denies vaginal bleeding, more vaginal discharge.

## 2015-08-06 NOTE — Anesthesia Preprocedure Evaluation (Signed)
Anesthesia Evaluation  Patient identified by MRN, date of birth, ID band Patient awake    Reviewed: Allergy & Precautions, NPO status , Patient's Chart, lab work & pertinent test results  History of Anesthesia Complications Negative for: history of anesthetic complications  Airway Mallampati: II  TM Distance: >3 FB Neck ROM: Full    Dental no notable dental hx. (+) Dental Advisory Given   Pulmonary neg pulmonary ROS,    Pulmonary exam normal breath sounds clear to auscultation       Cardiovascular negative cardio ROS Normal cardiovascular exam Rhythm:Regular Rate:Normal     Neuro/Psych  Headaches, negative psych ROS   GI/Hepatic negative GI ROS, Neg liver ROS,   Endo/Other  obesity  Renal/GU negative Renal ROS  negative genitourinary   Musculoskeletal negative musculoskeletal ROS (+)   Abdominal   Peds negative pediatric ROS (+)  Hematology negative hematology ROS (+)   Anesthesia Other Findings   Reproductive/Obstetrics (+) Pregnancy                             Anesthesia Physical Anesthesia Plan  ASA: II  Anesthesia Plan: Epidural   Post-op Pain Management:    Induction:   Airway Management Planned:   Additional Equipment:   Intra-op Plan:   Post-operative Plan:   Informed Consent: I have reviewed the patients History and Physical, chart, labs and discussed the procedure including the risks, benefits and alternatives for the proposed anesthesia with the patient or authorized representative who has indicated his/her understanding and acceptance.   Dental advisory given  Plan Discussed with:   Anesthesia Plan Comments:         Anesthesia Quick Evaluation

## 2015-08-06 NOTE — Anesthesia Procedure Notes (Signed)
Epidural Patient location during procedure: OB  Staffing Anesthesiologist: Calyx Hawker Performed by: anesthesiologist   Preanesthetic Checklist Completed: patient identified, site marked, surgical consent, pre-op evaluation, timeout performed, IV checked, risks and benefits discussed and monitors and equipment checked  Epidural Patient position: sitting Prep: site prepped and draped and DuraPrep Patient monitoring: continuous pulse ox and blood pressure Approach: midline Location: L3-L4 Injection technique: LOR saline  Needle:  Needle type: Tuohy  Needle gauge: 17 G Needle length: 9 cm and 9 Needle insertion depth: 6 cm Catheter type: closed end flexible Catheter size: 19 Gauge Catheter at skin depth: 11 cm Test dose: negative  Assessment Events: blood not aspirated, injection not painful, no injection resistance, negative IV test and no paresthesia  Additional Notes Patient identified. Risks/Benefits/Options discussed with patient including but not limited to bleeding, infection, nerve damage, paralysis, failed block, incomplete pain control, headache, blood pressure changes, nausea, vomiting, reactions to medication both or allergic, itching and postpartum back pain. Confirmed with bedside nurse the patient's most recent platelet count. Confirmed with patient that they are not currently taking any anticoagulation, have any bleeding history or any family history of bleeding disorders. Patient expressed understanding and wished to proceed. All questions were answered. Sterile technique was used throughout the entire procedure. Please see nursing notes for vital signs. Test dose was given through epidural catheter and negative prior to continuing to dose epidural or start infusion. Warning signs of high block given to the patient including shortness of breath, tingling/numbness in hands, complete motor block, or any concerning symptoms with instructions to call for help. Patient was  given instructions on fall risk and not to get out of bed. All questions and concerns addressed with instructions to call with any issues or inadequate analgesia.

## 2015-08-07 ENCOUNTER — Encounter (HOSPITAL_COMMUNITY): Payer: Self-pay

## 2015-08-07 LAB — CBC
HCT: 32.5 % — ABNORMAL LOW (ref 36.0–46.0)
Hemoglobin: 11.1 g/dL — ABNORMAL LOW (ref 12.0–15.0)
MCH: 28 pg (ref 26.0–34.0)
MCHC: 34.2 g/dL (ref 30.0–36.0)
MCV: 82.1 fL (ref 78.0–100.0)
Platelets: 209 10*3/uL (ref 150–400)
RBC: 3.96 MIL/uL (ref 3.87–5.11)
RDW: 14.4 % (ref 11.5–15.5)
WBC: 18.9 10*3/uL — ABNORMAL HIGH (ref 4.0–10.5)

## 2015-08-07 LAB — RPR: RPR Ser Ql: NONREACTIVE

## 2015-08-07 LAB — ABO/RH: ABO/RH(D): A POS

## 2015-08-07 MED ORDER — TETANUS-DIPHTH-ACELL PERTUSSIS 5-2.5-18.5 LF-MCG/0.5 IM SUSP: 0.5000 mL | Freq: Once | INTRAMUSCULAR | Status: DC

## 2015-08-07 MED ORDER — MEDROXYPROGESTERONE ACETATE 150 MG/ML IM SUSP: 150.0000 mg | INTRAMUSCULAR | Status: DC | PRN

## 2015-08-07 MED ORDER — WITCH HAZEL-GLYCERIN EX PADS: 1.0000 "application " | MEDICATED_PAD | CUTANEOUS | Status: DC | PRN

## 2015-08-07 MED ORDER — FLEET ENEMA 7-19 GM/118ML RE ENEM: 1.0000 | ENEMA | Freq: Every day | RECTAL | Status: DC | PRN

## 2015-08-07 MED ORDER — MEASLES, MUMPS & RUBELLA VAC ~~LOC~~ INJ
0.5000 mL | INJECTION | Freq: Once | SUBCUTANEOUS | Status: DC
  Filled 2015-08-07: qty 0.5

## 2015-08-07 MED ORDER — DIBUCAINE 1 % RE OINT: 1.0000 "application " | TOPICAL_OINTMENT | RECTAL | Status: DC | PRN

## 2015-08-07 MED ORDER — SENNOSIDES-DOCUSATE SODIUM 8.6-50 MG PO TABS
2.0000 | ORAL_TABLET | ORAL | Status: DC
  Administered 2015-08-07 – 2015-08-09 (×2): 2 via ORAL
  Filled 2015-08-07 (×2): qty 2

## 2015-08-07 MED ORDER — DIPHENHYDRAMINE HCL 25 MG PO CAPS: 25.0000 mg | ORAL_CAPSULE | Freq: Four times a day (QID) | ORAL | Status: DC | PRN

## 2015-08-07 MED ORDER — ONDANSETRON HCL 4 MG/2ML IJ SOLN: 4.0000 mg | INTRAMUSCULAR | Status: DC | PRN

## 2015-08-07 MED ORDER — ZOLPIDEM TARTRATE 5 MG PO TABS: 5.0000 mg | ORAL_TABLET | Freq: Every evening | ORAL | Status: DC | PRN

## 2015-08-07 MED ORDER — OXYCODONE-ACETAMINOPHEN 5-325 MG PO TABS
1.0000 | ORAL_TABLET | ORAL | Status: DC | PRN
  Administered 2015-08-07 – 2015-08-08 (×3): 1 via ORAL
  Filled 2015-08-07 (×3): qty 1

## 2015-08-07 MED ORDER — BISACODYL 10 MG RE SUPP: 10.0000 mg | Freq: Every day | RECTAL | Status: DC | PRN

## 2015-08-07 MED ORDER — BENZOCAINE-MENTHOL 20-0.5 % EX AERO
1.0000 "application " | INHALATION_SPRAY | CUTANEOUS | Status: DC | PRN
  Filled 2015-08-07 (×2): qty 56

## 2015-08-07 MED ORDER — ONDANSETRON HCL 4 MG PO TABS: 4.0000 mg | ORAL_TABLET | ORAL | Status: DC | PRN

## 2015-08-07 MED ORDER — ACETAMINOPHEN 325 MG PO TABS
650.0000 mg | ORAL_TABLET | ORAL | Status: DC | PRN
  Administered 2015-08-07 – 2015-08-09 (×4): 650 mg via ORAL
  Filled 2015-08-07 (×4): qty 2

## 2015-08-07 MED ORDER — PRENATAL MULTIVITAMIN CH
1.0000 | ORAL_TABLET | Freq: Every day | ORAL | Status: DC
  Administered 2015-08-07 – 2015-08-09 (×3): 1 via ORAL
  Filled 2015-08-07 (×3): qty 1

## 2015-08-07 MED ORDER — LANOLIN HYDROUS EX OINT: TOPICAL_OINTMENT | CUTANEOUS | Status: DC | PRN

## 2015-08-07 MED ORDER — SIMETHICONE 80 MG PO CHEW: 80.0000 mg | CHEWABLE_TABLET | ORAL | Status: DC | PRN

## 2015-08-07 MED ORDER — IBUPROFEN 600 MG PO TABS
600.0000 mg | ORAL_TABLET | Freq: Four times a day (QID) | ORAL | Status: DC
  Administered 2015-08-07 – 2015-08-09 (×10): 600 mg via ORAL
  Filled 2015-08-07 (×10): qty 1

## 2015-08-07 NOTE — Progress Notes (Signed)
Provider contacted and made aware of tachycardia. Patient is not symptomatic, reports no sensation of heart racing nor feeling SOB, and afibrile. Will continue to monitor

## 2015-08-07 NOTE — Progress Notes (Signed)
Patient is doing well   Patient's heart rate elevated 120s to 130s consistently   FHR 140s  There was a deceleration for 7 minutes when patient was vomiting. FHR now with good variability and scalp stim with exam  Cervix is C/C/+1  Will start pushing Continue to monitor Patient's heart rate - tachycardia should resolve after delivery

## 2015-08-07 NOTE — Lactation Note (Signed)
This note was copied from the chart of [place]. Lactation Consultation Note  Initial visit made.  Baby is 38 hours old and has not latched to breast.  He currently is showing feeding cues and placed skin to skin with mom.  Mom has areolar edema in both breasts which makes latching difficult.  Hand expression done but no colostrum seen.  Manual pump used to evert nipple more but baby still can't latch.  Shells put on for mom to wear between feedings.  24 mm nipple shield applied but no suck elicited.  Reassured parents and discussed first 24 hours.  Baby back to sleep.  Encouraged to watch for feeding cues and to call for assist.  Patient Name: Tamara Spencer [id number] Date: 08/07/2015 Reason for consult: Initial assessment;Difficult latch   Maternal Data    Feeding Feeding Type: Breast Fed  LATCH Score/Interventions Latch: Too sleepy or reluctant, no latch achieved, no sucking elicited. Intervention(s): Skin to skin;Teach feeding cues;Waking techniques Intervention(s): Breast compression;Breast massage;Assist with latch;Adjust position  Audible Swallowing: None  Type of Nipple: Everted at rest and after stimulation  Comfort (Breast/Nipple): Soft / non-tender     Hold (Positioning): Assistance needed to correctly position infant at breast and maintain latch. Intervention(s): Breastfeeding basics reviewed;Support Pillows;Position options;Skin to skin  LATCH Score: 5  Lactation Tools Discussed/Used     Consult Status Consult Status: Follow-up Date: 08/07/15 Follow-up type: In-patient    Ave Filter 08/07/2015, 2:04 PM

## 2015-08-07 NOTE — Anesthesia Postprocedure Evaluation (Signed)
Anesthesia Post Note  Patient: Tamara Spencer  Procedure(s) Performed: * No procedures listed *  Patient location during evaluation: Mother Baby Anesthesia Type: Epidural Level of consciousness: awake Pain management: satisfactory to patient Vital Signs Assessment: post-procedure vital signs reviewed and stable Respiratory status: spontaneous breathing Cardiovascular status: stable Anesthetic complications: no    Last Vitals:  Filed Vitals:   08/07/15 1026 08/07/15 1155  BP:  128/79  Pulse: 120 99  Temp:  36.7 C  Resp:  20    Last Pain:  Filed Vitals:   08/07/15 1158  PainSc: 6                  Judd Mccubbin

## 2015-08-08 NOTE — Progress Notes (Signed)
Post Partum Day 1 Subjective: no complaints, up ad lib, voiding, tolerating PO and + flatus No prior h/o of tacycardia Objective: Blood pressure 136/84, pulse 103, temperature 98.8 F (37.1 C), temperature source Axillary, resp. rate 18, height 5\' 9"  (1.753 m), weight 234 lb (106.142 kg), last menstrual period 10/26/2014, SpO2 100 %, unknown if currently breastfeeding.  Physical Exam:  General: alert and cooperative Lochia: appropriate Uterine Fundus: firm Incision: healing well DVT Evaluation: No evidence of DVT seen on physical exam. Negative Homan's sign. No cords or calf tenderness. Calf/Ankle edema is present.   Recent Labs  08/06/15 1810 08/07/15 0703  HGB 12.3 11.1*  HCT 36.1 32.5*    Assessment/Plan: Plan for discharge tomorrow and Circumcision prior to discharge   LOS: 2 days   CURTIS,CAROL G 08/08/2015, 8:13 AM   Agree with above.  Patient was counseled for circ including risk of bleeding, infection, and scarring.  All questions were answered and the patient wishes to proceed.    Linda Hedges, DO

## 2015-08-08 NOTE — Lactation Note (Signed)
This note was copied from the chart of [place]. Lactation Consultation Note Follow up visit at 41 hours of age.  Mom is feeling encouraged and has been following feeding plan with supplementation of EBM and formula per guidelines and post pumping for 15 minutes.  Encouraged mom to try hand expression after pumping and advised EBM is ok at room temperature for 6-8 hours to be used at future feedings.  Mom will need additional NS and o/p appt. At discharge.  Mom to call for assist as needed.    Patient Name: Boy Corabel Lubow [id number] Date: 08/08/2015 Reason for consult: Follow-up assessment   Maternal Data    Feeding Feeding Type: Breast Fed Length of feed: 25 min  LATCH Score/Interventions Latch: Repeated attempts needed to sustain latch, nipple held in mouth throughout feeding, stimulation needed to elicit sucking reflex. Intervention(s): Skin to skin;Teach feeding cues;Waking techniques Intervention(s): Adjust position;Assist with latch;Breast compression  Audible Swallowing: Spontaneous and intermittent (with 26fr) Intervention(s): Skin to skin;Hand expression Intervention(s): Skin to skin;Hand expression  Type of Nipple: Flat Intervention(s): Shells;Hand pump;Double electric pump  Comfort (Breast/Nipple): Soft / non-tender     Hold (Positioning): No assistance needed to correctly position infant at breast. Intervention(s): Breastfeeding basics reviewed  LATCH Score: 8  Lactation Tools Discussed/Used Tools: 37F feeding tube / Syringe;Nipple Shields Nipple shield size: 20   Consult Status Consult Status: Follow-up Date: 08/09/15 Follow-up type: In-patient    Shoptaw, Justine Null 08/08/2015, 4:18 PM

## 2015-08-08 NOTE — Lactation Note (Signed)
This note was copied from the chart of [place]. Lactation Consultation Note Mom having difficulty latching baby. Mom has flat nipples, baby fussy, arching, doesn't want to latch, hungry. Has very high palate, upper lip labial frenulum, limited mobility to tongue. Hand expressed 2 ml colostrum from mom, inserted colostrum into NS w/syring. Baby latched well and suckled at breast. After colostrum run out then baby get fussy, arching, screaming, will not suckle at breast. Put STS, baby still not calmed, LC burped baby and calmed. Inserted formula into NS, relatched baby to NS. Baby suckled at breast, after formula gone baby pacified by suckling on breast at slow intervals. A lot of teaching done during assist. Mom upset d/t nothing coming out when she pumped. Explained that's normal. Mom was happier when able to hand express colostrum. FOB at bedside and supportive.  Patient Name: Tamara Spencer [id number] Date: 08/08/2015 Reason for consult: Follow-up assessment   Maternal Data    Feeding Feeding Type: Breast Milk with Formula added Length of feed: 20 min  LATCH Score/Interventions Latch: Repeated attempts needed to sustain latch, nipple held in mouth throughout feeding, stimulation needed to elicit sucking reflex. Intervention(s): Skin to skin;Teach feeding cues;Waking techniques Intervention(s): Adjust position;Assist with latch;Breast massage;Breast compression  Audible Swallowing: A few with stimulation Intervention(s): Skin to skin;Hand expression Intervention(s): Alternate breast massage  Type of Nipple: Flat Intervention(s): Double electric pump;Shells  Comfort (Breast/Nipple): Soft / non-tender     Hold (Positioning): Full assist, staff holds infant at breast Intervention(s): Breastfeeding basics reviewed;Support Pillows;Position options;Skin to skin  LATCH Score: 5  Lactation Tools Discussed/Used Tools: Shells;Nipple [place] Fuel;Pump Nipple shield size: 20 Shell  Type: Inverted Breast pump type: Double-Electric Breast Pump Pump Review: Setup, frequency, and cleaning Initiated by:: RN Date initiated:: 08/08/15   Consult Status Consult Status: Follow-up Date: 08/08/15 ([place] pm) Follow-up type: In-patient    Tamara Spencer, Tamara Spencer 08/08/2015, 6:11 AM

## 2015-08-09 ENCOUNTER — Inpatient Hospital Stay (HOSPITAL_COMMUNITY): Admission: RE | Admit: 2015-08-09 | Payer: 59 | Source: Ambulatory Visit

## 2015-08-09 MED ORDER — OXYCODONE-ACETAMINOPHEN 5-325 MG PO TABS: 1.0000 | ORAL_TABLET | ORAL | Status: DC | PRN

## 2015-08-09 MED ORDER — IBUPROFEN 600 MG PO TABS: 600.0000 mg | ORAL_TABLET | Freq: Four times a day (QID) | ORAL | Status: DC

## 2015-08-09 NOTE — Lactation Note (Signed)
This note was copied from the chart of Tamara Spencer. Lactation Consultation Note  Patient Name: Tamara Spencer [id number] Date: 08/09/2015 Reason for consult: Follow-up assessment;Difficult latch;Infant weight loss   Follow up with mom, dad, gm, and 37 hour old infant. Infant with 8 BF for 15-40 minutes, 5 supplementations of EBM of 0.5-6cc. 8 formula supplementations of 2-10 cc, 4 voids and 2 stools in last 24 hours. Infant weight 8lb 8 oz today with a weight loss of 7% since birth. LATCH scores 7-8 by bedside RN's. Infant being supplemented at breast with 5 fr feeding tube SNS, supplementation in NS,  or curved tip syringe. Mom is pumping every 3 hours for 15 minutes and volume is increasing with pumping.(~ 12 cc this am) Mom reports she is pumping while infant STS and notices more milk this way. Mom is pumping to elongate nipple ac and wearing breast shells between feeds. Nipples are more erect today per mom and edema to breast tissue is decreased. Nipples are a little thick with compression, breasts are soft and compressible. Dad was holding infant when I went into the room, he had recently finished feeding and was drowsy. By the end of my visit, infant with early cue to feed. Placed infant STS on left breast in football hold. After a few minutes and dripping formula via curved tip syringe to corner of mouth, infant latched deeply. He had flanged lips, rhythmic sucking and intermittent swallows. Infant fed for 20 minutes and towards end of feeding infant noted to have multiple frequent swallows. Mom was intermittently compressing/massaging breast with feeding. Mom has a PIS at home for use and is to call and make Ped appt in next 2 days for weight check. OP appt made for 2/6 @ 1015 due to nipple shield use, appointment reminder given. Reviewed all Bf information in Taking Care of Baby and Me Booklet. Engorgement prevention/treatment reviewed. Reviewed I/O and maintaining feeding log to take to [facility] &  [facility] visit. Parents voiced understanding to all teaching.   Plan: Feed infant at breat 8-12 x in 24 hours at first feeding cues for a minimum of 15 minutes Use NS as needed for infant to latch. May use 5 FR feeding tube for supplementation at breast with or without the NS Supplement with EBM/Formula at breast until milk is in using 5 fr feeding tube at breast. Increase supplement at breast to about 30 ml/feeding, supplement post BF using finger feeding if needed If infant BF well and swallows are heard supplementation may begin weaning or not needed Pump after BF for 15 minutes, followed by hand expression, if NS is not used and  infant has a good feeding at the breast and breast softens and swallows noted, only need to pump for over fullness for comfort post feed Follow up with Ped Wed 2/1 or Thurs 2/2 for weight check Follow up with Lactation OP 2/6 @ 10:15 Call Frederic for questions/concerns prn    Maternal Data Formula Feeding for Exclusion: No Has patient been taught Hand Expression?: Yes Does the patient have breastfeeding experience prior to this delivery?: No  Feeding Feeding Type: Breast Fed Length of feed: 40 min  LATCH Score/Interventions Latch: Grasps breast easily, tongue down, lips flanged, rhythmical sucking. Intervention(s): Skin to skin;Teach feeding cues;Waking techniques Intervention(s): Breast massage;Breast compression;Assist with latch  Audible Swallowing: Spontaneous and intermittent  Type of Nipple: Everted at rest and after stimulation  Comfort (Breast/Nipple): Soft / non-tender     Hold (Positioning): Assistance needed  to correctly position infant at breast and maintain latch. Intervention(s): Breastfeeding basics reviewed;Support Pillows;Position options;Skin to skin  LATCH Score: 9  Lactation Tools Discussed/Used Nipple shield size: 20 WIC Program: No Pump Review: Setup, frequency, and cleaning;Milk Storage   Consult Status Consult Status:  Follow-up Date: 08/15/15 Follow-up type: Out-patient    Tamara Spencer Tamara Spencer 08/09/2015, 11:47 AM

## 2015-08-09 NOTE — Discharge Summary (Signed)
Obstetric Discharge Summary Reason for Admission: onset of labor Prenatal Procedures: ultrasound Intrapartum Procedures: vacuum Postpartum Procedures: none Complications-Operative and Postpartum: RML episiotomy HEMOGLOBIN  Date Value Ref Range Status  08/07/2015 11.1* 12.0 - 15.0 g/dL Final   HCT  Date Value Ref Range Status  08/07/2015 32.5* 36.0 - 46.0 % Final    Physical Exam:  General: alert and cooperative Lochia: appropriate Uterine Fundus: firm Incision: healing well DVT Evaluation: No evidence of DVT seen on physical exam. Negative Homan's sign. No cords or calf tenderness. Calf/Ankle edema is present.  Discharge Diagnoses: Term Pregnancy-delivered  Discharge Information: Date: 08/09/2015 Activity: pelvic rest Diet: routine Medications: PNV, Ibuprofen and Percocet Condition: stable Instructions: refer to practice specific booklet Discharge to: home   Newborn Data: Live born female  Birth Weight: 9 lb 2.2 oz (4145 g) APGAR: 7, 9  Home with mother.  CURTIS,CAROL G 08/09/2015, 8:40 AM

## 2015-08-15 ENCOUNTER — Ambulatory Visit (HOSPITAL_COMMUNITY)
Admit: 2015-08-15 | Discharge: 2015-08-15 | Disposition: A | Discharge status: Home / Self Care | Payer: 59 | Attending: Obstetrics and Gynecology | Admitting: Obstetrics and Gynecology

## 2015-08-15 NOTE — Lactation Note (Signed)
Lactation Consult  Mother's reason for visit: Concerned about latch Visit Type:  Feeding assessment Appointment Notes:  Sore nipples and using SNS when left the hospitl Consult:  Initial Lactation Consultant:  Truddie Crumble  ________________________________________________________________________  [name] Name: Tamara Spencer Date of Birth: 08/07/2017 Pediatrician: Rosana Hoes Gender: female Gestational Age: [redacted]w[redacted]d (At Birth) Birth Weight: 9 lb 2.2 oz (4145 g) Weight at Discharge: Weight: 8 lb 8 oz (3855 g) (3)Date of Discharge: 08/09/2015 Filed Weights   08/07/15 0413 08/07/15 2343 08/08/15 2350  Weight: 9 lb 2.2 oz (4145 g) 8 lb 12.8 oz (3992 g) 8 lb 8 oz (3855 g)     Weight today: 8- 9.3 oz  3892 g Up 4.3 oz since Thursday  ________________________________________________________________________  Mother's Name: Moshe Cipro Type of delivery:  Vaginal Breastfeeding Experience:  P1 ________________________________________________________________________  Breastfeeding History (Post Discharge)  Frequency of breastfeeding:  q 2-3 hours Duration of feeding:  30-40 min  Supplementation  Formula:  Volume 0 ml none in the last week  Breastmilk:  Volume 12 ml Frequency:  Once a day at night  Method:  Fingerfeeding,   Pumping  Type of pump:  Medela pump in style Frequency:  Once per day Volume:  10-20 ml  Infant Intake and Output Assessment  Voids:  6-8 in 24 hrs.  Color:  Clear yellow Stools:  7-10 in 24 hrs.  Color:  Yellow  ________________________________________________________________________  Maternal Breast Assessment  Breast:  Filling Nipple:  Erect  _______________________________________________________________________ Feeding Assessment/Evaluation  Initial feeding assessment:    Positioning:  Cross cradle Left breast  LATCH documentation:  Latch:  2 = Grasps breast easily, tongue down, lips flanged, rhythmical  sucking.  Audible swallowing:  2 = Spontaneous and intermittent  Type of nipple:  2 = Everted at rest and after stimulation  Comfort (Breast/Nipple):  2 = Soft / non-tender  Hold (Positioning):  1 = Assistance needed to correctly position infant at breast and maintain latch  LATCH score:  9  Attached assessment:  Deep  Lips flanged:  Yes.    Lips untucked:  No.  Suck assessment:  Nutritive and Nonnutritive   Pre-feed weight:  3892 g   8-9.3 oz Post-feed weight:  3906 g 8- 9.8 oz Amount transferred:  14 ml  [name] latched well to left breast with minimal assist from me. Lots of swallows noted at beginning of feeding. Then he is getting sleepy and nonnutritive  Needed some stimulation to continue nursing. Mom reports he fed very well 2 hours ago,.  Pre-feed weight:  3906 g 8- 9.8 oz  Post-feed weight:  3914 g 8- 10.1 oz Amount transferred:  8 ml  [name] latched well to right breast in football hold. Suggested changing positions to completely empty breast and for nipple healing. Getting sleepy at the breast. Reviewed keeping the baby close to the breast with active sucking and swallows. Mom reports other feedings are better. Encouraged continued pumping to promote milk supply. No further questions at present. Reviewed [facility] as resource for weight check and support To call prn. [facility] to come on Wednesday. To see Ped on Monday.     Total amount transferred:  23 ml

## 2015-08-23 MED FILL — CEPHALEXIN 500 MG CAPSULE: 500 | 7 days supply | Qty: 21 | Fill #0

## 2015-08-23 MED FILL — IBUPROFEN 600 MG TABLET: 600 | 8 days supply | Qty: 30 | Fill #0

## 2015-08-24 ENCOUNTER — Ambulatory Visit (HOSPITAL_COMMUNITY)
Admission: RE | Admit: 2015-08-24 | Discharge: 2015-08-24 | Disposition: A | Discharge status: Home / Self Care | Payer: 59 | Source: Ambulatory Visit | Attending: Obstetrics and Gynecology | Admitting: Obstetrics and Gynecology

## 2015-08-24 NOTE — Lactation Note (Signed)
Lactation Consult  Mother's reason for visit:  Weight check Visit Type:  Outpatient Appointment Notes:  See below Consult:  Initial Lactation Consultant:  Broadus John  ________________________________________________________________________ Tamara Spencer Name: Tamara Spencer Date of Birth: 08/07/2017 Pediatrician: Myrna Blazer [facility]) Gender: female Gestational Age: [redacted]w[redacted]d (At Birth) Birth Weight: 9 lb 2.2 oz (4145 g) Weight at Discharge: Weight: 8 lb 8 oz (3855 g) (3)Date of Discharge: 08/09/2015 Filed Weights   08/07/15 0413 08/07/15 2343 08/08/15 2350  Weight: 9 lb 2.2 oz (4145 g) 8 lb 12.8 oz (3992 g) 8 lb 8 oz (3855 g)   Last weight taken from location outside of [facility]: 8 lbs 5 oz at Pediatrician 08/11/15 Location:  Lactation Outpatient Weight today:8 lbs 13.9 oz    Tamara Spencer came in today for a weight check.  Baby is exclusively breast fed, 74 days old.  Mom does use a 5 fr feeding tube and syringe once a day, and pump once a day. Baby has been a slow weight gainer, and Mom was diagnosed with mastitis of right breast, and put on oral antibiotics yesterday.  Mom has noticed baby not feeding as well on that side for a couple days.  Mom feeling better now  Assessment- Mom latched baby easily in cross cradle hold, not using any pillow support.  Discussed importance of supporting baby at nipple level.  Baby latched deeper with support.  Baby fed sleepily with minimal swallowing heard.  20 mins skin to skin, and offering stimulation, weight gain 16 ml left breast. Nipple noted to be pinched at tip, some blistering. Switched to right breast in football hold, and baby was less nutritive.  0 ml after 15 mins.  Discussed ways to increase baby's nutritive sucking, and to support her milk supply.  Initiated a SNS on right breast.  Baby much more nutritive, and baby took 8 ml of EBM, for total weight gain of 8 only.   Noted on digital oral exam, a high arched  palate, and a posterior tongue tie noted.  Baby sucked and back of tongue rubbed again finger tip.    Plan- -Breast feed on cue, using SNS on 2nd breast with 60 ml of EBM -Pump both breasts after breast feeding. -if no SNS used, supplement with slow flow bottle 30-60 ml after breast feeding.  -Follow up 08/30/15 @ 2:30pm        ________________________________________________________________________  Mother's Name: Tamara Spencer Type of delivery:  Vaginal Breastfeeding Experience:  none Maternal Medical Conditions:  Breast Mastitis  Maternal Medications:  Cephalexin, PNV, Colace  ________________________________________________________________________  Breastfeeding History (Post Discharge)  Frequency of breastfeeding:  2-3 hrs Duration of feeding:  25-35 mins    Pumping  Type of pump:  Medela pump in style Frequency:  1 times Volume:  60 ml  Infant Intake and Output Assessment  Voids:  8-11 in 24 hrs.  Color:  Clear yellow Stools: 8-10 in 24 hrs.  Color:  Yellow  ________________________________________________________________________  Maternal Breast Assessment  Breast:  Soft Nipple:  Erect and Blister Pain level:  2 Pain interventions:  Expressed breast milk  _______________________________________________________________________

## 2015-08-30 ENCOUNTER — Ambulatory Visit (HOSPITAL_COMMUNITY)
Admission: RE | Admit: 2015-08-30 | Discharge: 2015-08-30 | Disposition: A | Discharge status: Home / Self Care | Payer: 59 | Source: Ambulatory Visit | Attending: Obstetrics and Gynecology | Admitting: Obstetrics and Gynecology

## 2015-08-30 NOTE — Lactation Note (Signed)
Lactation Consult  Mother's reason for visit:  mother was seen last week for low milk supply .  Visit Type:  feeding assessment Appointment Notes:  Mother states that  follow up last week revealed that  infant had only gained one ounce. Mother has been pumping and using the SNS to give  60 ml with each feeding. Mother has been bottle feeding and then breastfeeding.  Mother is now giving 2 ounce every 3 hours then she breastfeeds 10-12 mins.   Consult:  Follow-Up Lactation Consultant:  Tamara Spencer  ________________________________________________________________________ Tamara Spencer Name: Tamara Spencer Date of Birth: 08/07/2017 Pediatrician:Dr Tamara Spencer Gender: female Gestational Age: [redacted]w[redacted]d (At Birth) Birth Weight: 9 lb 2.2 oz (4145 g) Weight at Discharge: Weight: 8 lb 8 oz (3855 g) (3)Date of Discharge: 08/09/2015 Filed Weights   08/07/15 0413 08/07/15 2343 08/08/15 2350  Weight: 9 lb 2.2 oz (4145 g) 8 lb 12.8 oz (3992 g) 8 lb 8 oz (3855 g)   Weight today: [id number]      ________________________________________________________________________  Mother's Name: Tamara Spencer Type of delivery:  vaginal del Breastfeeding Experience:  none Maternal Medical Conditions:  PIH while in the hospital Maternal Medications:  Prenatal vits, Mother states that she had antiobiotic for Mastitis ( finished)  ________________________________________________________________________  Breastfeeding History (Post Discharge)  Frequency of breastfeeding: 7-8 times per day Duration of feeding:  10-15 mins  Supplementation  Formula:  Volume 60 ml Frequency:  2 times daily        Brand: Similac  Breastmilk:  Volume  1-1 1/2 - 2 ounces every 3 hours   Method:  Bottle,   Pumping  Type of pump:    Infant Intake and Output Assessment  Voids: 9-11  in 24 hrs.  Color:  Clear yellow Stools: 8-10  in 24 hrs.  Color:   Yellow  ________________________________________________________________________  Maternal Breast Assessment  Breast:  Full Nipple:  Erect Pain level:  3 Pain interventions:  Bra  _______________________________________________________________________ Feeding Assessment/Evaluation:  Mother latched infant independently with very little assistance. Infant keeps upper lip tightly rolled in . Attempt to roll infants lip upward multiple times during feeding. Mother taught breast compression and observed infant with intermittent pattern of suckling and swallows. Infant discontent at breast. Mother unlatched infant several times and relatched. Infant pulling back frequently from both breast. Infant transferred total of 30 ml.   Infant's oral assessment:  Variance, upper lip tie, high palate  Positioning:  Cross cradle Right breast  LATCH documentation:  Latch:  2 = Grasps breast easily, tongue down, lips flanged, rhythmical sucking.  Audible swallowing:  2 = Spontaneous and intermittent  Type of nipple:  2 = Everted at rest and after stimulation  Comfort (Breast/Nipple):  1 = Filling, red/small blisters or bruises, mild/mod discomfort  Hold (Positioning):  1 = Assistance needed to correctly position infant at breast and maintain latch  LATCH score:  8  Attached assessment:  Deep  Lips flanged:  Yes.    Lips untucked:  Yes.    Suck assessment:  Displays both   Pre-feed weight: 4164, 9-2.8  Post-feed weight:4178, 9-3.4   Amount transferred:  14 ml    Infant's oral assessment:  Variance  Positioning:  Football Left breast  LATCH documentation:  Latch:  2 = Grasps breast easily, tongue down, lips flanged, rhythmical sucking.  Audible swallowing:  2 = Spontaneous and intermittent  Type of nipple:  2 = Everted at rest and after stimulation  Comfort (Breast/Nipple):  1 = Filling,  red/small blisters or bruises, mild/mod discomfort  Hold (Positioning):  1 = Assistance needed to  correctly position infant at breast and maintain latch  LATCH score:  8  Attached assessment:  Deep  Lips flanged:  Yes.    Lips untucked:  Yes.     Pre-feed weight: 4178 Post-feed weight: 4194 Amount transferred:  16 ml Amount supplemented:  60 ml  Total amount transferred: 30 ml Total supplement given:  60 ml  Advised mother to breastfeed infant first with feeding cues and at least every 2-3 hours Suggested that mother supplement infant with 2-3 ounces of ebm after each breastfeeding.  Advised mother to continue to post pump at least 6 times daily for 20 mins Mother to take in lots of flds and nap frequently Suggested fenugreek or other supplements to increase milk volume Mother to follow up weekly for weight check Mother states she will follow up at [place] next week Tamara Spencer has an appt with Dr Tamara Spencer on Mar 1 for 1 month follow up visit.

## 2015-09-16 MED FILL — TERCONAZOLE 0.8% VAGINAL CR: 0.8 | 3 days supply | Qty: 20 | Fill #0

## 2015-09-22 DIAGNOSIS — Z1389 Encounter for screening for other disorder: Secondary | ICD-10-CM | POA: Diagnosis not present

## 2015-09-22 MED FILL — NORETHINDRONE 0.35 MG TAB: 0.35 | 28 days supply | Qty: 28 | Fill #0

## 2015-09-22 MED FILL — BETAMETHASONE VA 0.1% CREAM: 0.1 | 15 days supply | Qty: 45 | Fill #0

## 2015-09-22 MED FILL — valACYclovir HCL 1 GM TABS: 1 | 1 days supply | Qty: 4 | Fill #0

## 2015-10-14 ENCOUNTER — Ambulatory Visit (HOSPITAL_COMMUNITY)
Admission: RE | Admit: 2015-10-14 | Discharge: 2015-10-14 | Disposition: A | Discharge status: Home / Self Care | Payer: 59 | Source: Ambulatory Visit | Attending: Obstetrics and Gynecology | Admitting: Obstetrics and Gynecology

## 2015-10-14 NOTE — Lactation Note (Signed)
Lactation Consult: Weight today 11- 0.3 oz 5000 g Weighted 10- 10.5 at [facility] 10 days ago- has gained 6 oz in 10 days. Reports baby has just started sleeping 6 hours at night.  Has had concerns about milk supply since delivery. Discussed baby's tongue mobility and she states someone has already talked with her about it. Reports she has done some research on it but since he is mostly bottle feeding she is not going to do anything about it at this time, Has Medela Sonata pump- used # 27 flanges and they are the correct fit Reports no pain with pumping. Is hand express afterwards and gets a little more. Pumped about 3 oz. Grandmother bottle fed 4 oz of EBM. Mom did not want to put him to the breast since he usually bottle feeds during the day, Suggested offering more EBM or formula at each feeding 4-5 oz per feeding. Is eating oatmeal, lactation cookies and drinking Mother Love tea for a couple of days to increase supply. Is power pumping once a day. No further questions at present. Is coming to [facility]. To call if needs another appointment.  Mother's reason for visit:  Mom here today for assist with pumping- going back to work in 2 [place] Visit Type:  Pump assist Appointment Notes:  Baby now 80 J[address] Heard old, slow weight gain Consult:  Follow-Up Lactation Consultant:  Truddie Crumble  ________________________________________________________________________  [name] Name: Spero Geralds Date of Birth: 08/07/2017 Pediatrician: Rosana Hoes Gender: female Gestational Age: [redacted]w[redacted]d (At Birth) Birth Weight: 9 lb 2.2 oz (4145 g) Weight at Discharge: Weight: 8 lb 8 oz (3855 g) (3)Date of Discharge: 08/09/2015 Filed Weights   08/07/15 0413 08/07/15 2343 08/08/15 2350  Weight: 9 lb 2.2 oz (4145 g) 8 lb 12.8 oz (3992 g) 8 lb 8 oz (3855 g)     Weight today: 5000 g  11- 0.3 oz    ________________________________________________________________________  Mother's Name: Moshe Cipro  Breastfeeding  Experience:  P1 ________________________________________________________________________  Breastfeeding History (Post Discharge)  Frequency of breastfeeding:  Is only breast feeding a couple of times through the night, Is bottle feeding EBM during the day Duration of feeding:  20-30 min  Supplementation  Formula:  Volume 0 ml  Breastmilk:  Volume 3-4 oz      Frequency:  6 times/day  Method:  Bottle,   Pumping  Type of pump:  Medela sonata Frequency:  10 times/day Volume:  203 oz  I ________________________________________________________________________  Maternal Breast Assessment  Breast:  Soft Nipple:  Erect  _______________________________________________________________________ Feeding Assessment/Evaluation  Initial feeding assessment:  Infant's oral assessment:  Variance   Pre-feed weight:  5000 g  11-0.3 oz Amount supplemented:  4 oz EBM  Total amount pumped post feed: 3 oz  Total amount transferred:  0 ml Total supplement given:  4 oz

## 2015-11-07 MED FILL — NORETHINDRONE 0.35 MG TAB: 0.35 | 28 days supply | Qty: 28 | Fill #1

## 2015-12-09 MED FILL — NORETHINDRONE 0.35 MG TAB: 0.35 | 84 days supply | Qty: 84 | Fill #2

## 2016-01-04 MED FILL — valACYclovir HCL 1 GM TABS: 1 | 25 days supply | Qty: 12 | Fill #1 | Status: TO

## 2016-03-05 MED FILL — HEATHER TABLET: 0.35 | 84 days supply | Qty: 84 | Fill #3

## 2016-04-20 ENCOUNTER — Encounter: Payer: Self-pay | Admitting: Family Medicine

## 2016-04-23 ENCOUNTER — Ambulatory Visit (INDEPENDENT_AMBULATORY_CARE_PROVIDER_SITE_OTHER): Payer: 59 | Admitting: Family Medicine

## 2016-04-23 ENCOUNTER — Encounter: Payer: Self-pay | Admitting: Family Medicine

## 2016-04-23 VITALS — BP 134/94 | HR 76 | Ht 68.0 in | Wt 229.8 lb

## 2016-04-23 DIAGNOSIS — F4323 Adjustment disorder with mixed anxiety and depressed mood: Secondary | ICD-10-CM

## 2016-04-23 DIAGNOSIS — R42 Dizziness and giddiness: Secondary | ICD-10-CM | POA: Diagnosis not present

## 2016-04-23 DIAGNOSIS — L910 Hypertrophic scar: Secondary | ICD-10-CM

## 2016-04-23 DIAGNOSIS — E669 Obesity, unspecified: Secondary | ICD-10-CM

## 2016-04-23 DIAGNOSIS — E162 Hypoglycemia, unspecified: Secondary | ICD-10-CM

## 2016-04-23 NOTE — Progress Notes (Signed)
Chief Complaint  Patient presents with  . Advice Only    had a baby in January and ever since has had some issue with some dizziness and shakiness during the day-is breast feeding. Lost the 25lbs that she had gained with pregnancy but has gained it all back and 15 more. Wonders if maybe she is not getting the right calories or the right calories. Son will not latch correctly so she is exclusively pumping, is this an issue? This is stressing her. Not sleeping well at all. Gets up twice nightly to pump. Even when she sleeps it's not a well rested sleep.     Patient presents with concerns regarding nursing (her son is 51.5 months old), feels like she is obsessed with it, about not getting enough milk produced. She is complaining of dizziness frequently throughout the day. Notices this when standing, walking to a meeting at work.  If she rests against the wall, and waits a few seconds, the feeling passes very quickly. Starts to get a little black--closes eyes for a few seconds and feels better. Tingly at her temples, kind of tunnel vision. Drinks three 32 oz water bottles/d.  Milk output decreases if she doesn't drink enough. She worries about not making enough milk.  She finds that she is eating more, thinking that might help with her production. She thinks this may be why she has gained weight (in attempts to maintain her milk supply).  Drinks a regular soda 2-3 times/week for caffeine (doesn't drink coffee).  Sometimes she gets shaky if she goes a long time between eating. She feels better after eating/drinking something.  Cries frequently, starting at 4 months postpartum after going back to work. She worries that her son is more attached to his dad, not snuggling as much as she would like with her, she feels more attached to the pump. She spends her three breaks at work pumping milk, pumps while he is sleeping before going to work.  Seems unhappy about the time in the evening that she is pumping, rather  than spending quality time with her son while he is awake.  They do have a nice bedtime routine as a couple--reading books, son helps with pajamas, etc.  She does get to hold him while giving him his last bottle.  Family/friends are over on weekends, feels like she doesn't get as much individual time with him on the weekends.  Her mother watches him at her house when she is at work, works full time. He is asleep when she leaves in the morning.  She currently doesn't have time to exercise due to her pumping schedule--can't wake up earlier than 5am when she gets up to pump.  She walks some on the weekends. She has a plan for exercise once she is no longer pumping, involving using the fitness center on lunch break, and using the treadmill in the morning (when no longer pumping before work).  Keloid left shoulder returned and is larger. Eventually would like to have this treated. Also found a smaller one developing on the right upper arm. Noticed this about a year ago, but then got pregnant. No significant change over the last year or so.  BP was fine during pregnancy until her 8th month it started going up soon.  BP was high at time of labor. BP's have been fine at f/u visit with GYN  [facility], [place], [facility] reviewed  Outpatient Encounter Prescriptions as of 04/23/2016  Medication Sig  . norethindrone (Mirtha) 0.35 MG  tablet Take 1 tablet by mouth daily.  . Prenatal Vit-Fe Fumarate-FA (PRENATAL MULTIVITAMIN) TABS tablet Take 1 tablet by mouth at bedtime.  . [DISCONTINUED] ibuprofen (ADVIL,MOTRIN) 600 MG tablet Take 1 tablet (600 mg total) by mouth every 6 (six) hours.  . [DISCONTINUED] oxyCODONE-acetaminophen (PERCOCET/ROXICET) 5-325 MG tablet Take 1-2 tablets by mouth every 4 (four) hours as needed for moderate pain or severe pain.   No facility-administered encounter medications on file as of 04/23/2016.    Allergies  Allergen Reactions  . Adhesive [Tape] Rash   ROS:  No fever, chills, URI symptoms,  headaches.  +dizziness as per HPI. No chest pain, shortness of breath.  +crying, interrupted sleep, weight gain. No GI or GU complaints.  PHYSICAL EXAM:  BP 138/90 (BP Location: Right Arm, Patient Position: Sitting, Cuff Size: Normal)   Pulse 76   Ht 5\' 8"  (1.727 m)   Wt 229 lb 12.8 oz (104.2 kg)   Breastfeeding? Yes   BMI 34.94 kg/m   134/94 on repeat by MD. Tearful and anxious during visit HEENT: conjunctiva and sclera are clear, OP--lips are dry, mildly dry Neck: no lymphadenopathy, thyromegaly or mass Heart: regular rate and rhythm Lungs: clear bilaterally Extremities: no edema Abdomen: nontender, no mass Skin: large keloid left shoulder, nontender (smaller nodule R upper arm) Psych: normal hygiene, grooming.  Intermittently tearful, anxious.  Normal eye contact and speech.  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  Discussed that need will decrease as diet becomes more varied. Discussed using formula with cereal and to supplement breastmilk if needed.  Also briefly discussed that developmentally, as [name] starts to crawl and be more active, that there might be a dip in weight and for her to expect that and not think it is related to her milk production.   Counseled about small frequent meals/snacks to avoid hypoglycemia. Discussed exercise  recommendations and plans. Encouraged counseling through EAP, if not helpful, may need to consider medications.  Discussed how to have quality time with her son.  If ongoing problems with guilt, depression/anxiety, perhaps should consider cutting hours/part-time work if an option for her   F/u as sched in March   Over 30 minute visit, more than 1/2 spent counseling.  Drink more water--you seem to be drinking plenty, but that feeling of thirst, and that dizziness you are experiencing is likely due to being a little dehydrated.  Drink even more fluids. Your milk output is more related to your fluid status than your food intake--so if worried about your milk output that day, drink more water rather than snacking more (if eating adequate amount of calories).  Look into the [facility] through work for counseling. If they are concerned or if your moods are not improving, then return to discuss the possibility of medications.  Look into the [place] program, check into any nutrition support available.

## 2016-04-23 NOTE — Patient Instructions (Signed)
  Drink more water--you seem to be drinking plenty, but that feeling of thirst, and that dizziness you are experiencing is likely due to being a little dehydrated.  Drink even more fluids. Your milk output is more related to your fluid status than your food intake--so if worried about your milk output that day, drink more water rather than snacking more (if eating adequate amount of calories).  Look into the EAP through work for counseling. If they are concerned or if your moods are not improving, then return to discuss the possibility of medications.  Look into the [place] program, check into any nutrition support available.

## 2016-04-25 DIAGNOSIS — H5213 Myopia, bilateral: Secondary | ICD-10-CM | POA: Diagnosis not present

## 2016-04-25 DIAGNOSIS — H02402 Unspecified ptosis of left eyelid: Secondary | ICD-10-CM | POA: Diagnosis not present

## 2016-04-25 DIAGNOSIS — H52223 Regular astigmatism, bilateral: Secondary | ICD-10-CM | POA: Diagnosis not present

## 2016-05-25 MED FILL — HEATHER TABLET: 0.35 | 84 days supply | Qty: 84 | Fill #4

## 2016-06-04 MED FILL — valACYclovir HCL 1 GM TABS: 1 | 3 days supply | Qty: 12 | Fill #0

## 2016-08-01 ENCOUNTER — Telehealth: Payer: 59 | Admitting: Family

## 2016-08-01 DIAGNOSIS — R112 Nausea with vomiting, unspecified: Secondary | ICD-10-CM

## 2016-08-01 MED ORDER — ONDANSETRON HCL 4 MG PO TABS: 4.0000 mg | ORAL_TABLET | Freq: Three times a day (TID) | ORAL | 0 refills | Status: DC | PRN

## 2016-08-01 MED FILL — ONDANSETRON HCL 4 MG TABLET: 4 | 7 days supply | Qty: 20 | Fill #0

## 2016-08-01 NOTE — Progress Notes (Signed)
We are sorry that you are not feeling well. Here is how we plan to help!  Based on what you have shared with me it looks like you have a Virus that is irritating your GI tract.  Vomiting is the forceful emptying of a portion of the stomach's content through the mouth.  Although nausea and vomiting can make you feel miserable, it's important to remember that these are not diseases, but rather symptoms of an underlying illness.  When we treat short term symptoms, we always caution that any symptoms that persist should be fully evaluated in a medical office.  I have prescribed a medication that will help alleviate your symptoms and allow you to stay hydrated:  Zofran 4 mg 1 tablet every 8 hours as needed for nausea and vomiting   I sent this to [facility].   HOME CARE:  Drink clear liquids.  This is very important! Dehydration (the lack of fluid) can lead to a serious complication.  Start off with 1 tablespoon every 5 minutes for 8 hours.  You may begin eating bland foods after 8 hours without vomiting.  Start with saltine crackers, white bread, rice, mashed potatoes, applesauce.  After 48 hours on a bland diet, you may resume a normal diet.  Try to go to sleep.  Sleep often empties the stomach and relieves the need to vomit.  GET HELP RIGHT AWAY IF:   Your symptoms do not improve or worsen within 2 days after treatment.  You have a fever for over 3 days.  You cannot keep down fluids after trying the medication.  MAKE SURE YOU:   Understand these instructions.  Will watch your condition.  Will get help right away if you are not doing well or get worse.   Thank you for choosing an e-visit. Your e-visit answers were reviewed by a board certified advanced clinical practitioner to complete your personal care plan. Depending upon the condition, your plan could have included both over the counter or prescription medications. Please review your pharmacy choice. Be sure that  the pharmacy you have chosen is open so that you can pick up your prescription now.  If there is a problem you may message your provider in [place] to have the prescription routed to another pharmacy. Your safety is important to [place]. If you have drug allergies check your prescription carefully.  For the next 24 hours, you can use MyChart to ask questions about today's visit, request a non-urgent call back, or ask for a work or school excuse from your e-visit provider. You will get an e-mail in the next two days asking about your experience. I hope that your e-visit has been valuable and will speed your recovery.

## 2016-08-14 MED FILL — HEATHER TABLET: 0.35 | 28 days supply | Qty: 28 | Fill #5

## 2016-09-11 NOTE — Progress Notes (Signed)
Chief Complaint  Patient presents with  . Annual Exam    fasting annual exam, no pap-sees Dr.Fontaine and is UTD. Has rash on both arms when she gets hot she would like you to look at.     Tamara Spencer is a 26 y.o. female who presents for a complete physical.  She has the following concerns:  She reports having ongoing problems sleeping.  She wakes up frequently due to the baby, but she reports that even when her mother watches him, and she sleeps 8-9 hours, she never feels well rested.  Husband says she snores; improved some with weight loss, but is back to snoring loudly again (not as bad). He has reported hearing pauses in her breathing. She has no trouble falling asleep.  Rash on both upper arms for several years.  She notices it when she gets hot, with exercise, just looks red. While putting on lotion last night, she noticed a change in texture, rough.  She denies any itching, just notices a color change.  She reports losing 40# since October. She continues to pump breastmilk, plans to taper from pumping 3x/d to 2 now.  Baby is drinking whole milk without problems, but sleeps better with breastmilk at night.  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  Cycles restarted in December. She needs refill on Valtrex. Has a cold sore currently.   Immunization History  Administered Date(s) Administered  . HPV Quadrivalent 02/02/2011, 03/15/2011, 07/06/2011  . Influenza Split 04/08/2012, 04/12/2014  . Influenza Whole 04/15/2013  . Influenza-Unspecified 04/20/2016  . Td 07/09/2002  . Tdap 04/11/2009  additional TdaP during her pregnancy  Last Pap smear: April or May 2017 with [place]. No h/o abnormal paps. Last mammogram: never Last colonoscopy: never Last DEXA: never Dentist: twice yealry Ophtho: yearly Exercise: 2 weeks ago restarted exercise--30 minutes on treadmill (walk/run) 3-5 days/week. No regular weight-bearing exercise (but holds 24# baby all the time). Lipid: Lab  Results  Component Value Date   CHOL 148 05/20/2013   HDL 42 05/20/2013   LDLCALC 92 05/20/2013   TRIG 68 05/20/2013   CHOLHDL 3.5 05/20/2013   Thyroid screen: Lab Results  Component Value Date   TSH 0.985 03/11/2014   No Vitamin D testing done  Lab Results  Component Value Date   WBC 18.9 (H) 08/07/2015   HGB 11.1 (L) 08/07/2015   HCT 32.5 (L) 08/07/2015   MCV 82.1 08/07/2015   PLT 209 08/07/2015   Past Medical History:  Diagnosis Date  . 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  Patient Negative  . Headache    monthly; less severe since on new OCP  . Recurrent cold sores     Past Surgical History:  Procedure Laterality Date  . KELOID EXCISION    . WISDOM TOOTH EXTRACTION      Social History   Social History  . Marital status: Married    Spouse name: N/A  . Number of children: 0  . Years of education: N/A   Occupational History  . IT security team [place]   Social History Main Topics  . Smoking status: Never Smoker  . Smokeless tobacco: Never Used  . Alcohol use No  . Drug use: No  . Sexual activity: Yes    Partners: Male    Birth control/ protection: Pill   Other Topics Concern  . Not on file   Social History Narrative   Married, one son Enis Slipper).  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 Diabetes Father   . Hypertension Father   . Hyperlipidemia Father   . Heart disease Father 57  . Diabetes Paternal Grandfather   . Other Mother     Myleofibrosis  . Breast cancer Mother 74  . Breast cancer Other     dx in her 28s  . Leukemia Cousin 14    mother's maternal first cousin  . Breast cancer Cousin     mother's materanl first cousin dx in her 66s    Outpatient Encounter Prescriptions as of 09/12/2016  Medication Sig  . norethindrone ([place]rystal) 0.35 MG tablet Take 1 tablet (0.35 mg total) by mouth daily.  . Prenatal Vit-Fe Fumarate-FA (PRENATAL MULTIVITAMIN)  TABS tablet Take 1 tablet by mouth at bedtime.  . valACYclovir (VALTREX) 1000 MG tablet Take as directed (1 daily or 2 at onset of cold sore and repeat in 12 hours just one time)  . [DISCONTINUED] norethindrone (Mariafernanda) 0.35 MG tablet Take 1 tablet by mouth daily.  . [DISCONTINUED] valACYclovir (VALTREX) 1000 MG tablet Take 1,000 mg by mouth 2 (two) times daily.  . [DISCONTINUED] ondansetron (ZOFRAN) 4 MG tablet Take 1 tablet (4 mg total) by mouth every 8 (eight) hours as needed for nausea or vomiting.   No facility-administered encounter medications on file as of 09/12/2016.     Allergies  Allergen Reactions  . Adhesive [Tape] Rash    ROS:  The patient denies anorexia, fever, headaches, vision changes, decreased hearing, ear pain, sore throat, breast concerns, chest pain, palpitations, dizziness, syncope, dyspnea on exertion, cough, swelling, nausea, vomiting, diarrhea, constipation, abdominal pain, melena, hematochezia, indigestion/heartburn, hematuria, incontinence, dysuria, irregular menstrual cycles, vaginal discharge, odor or itch, genital lesions, joint pains, numbness, tingling, weakness, tremor, suspicious skin lesions, depression, anxiety, abnormal bleeding/bruising, or enlarged lymph nodes. No longer getting headaches. +intentional weight loss +unrefreshed sleep, snoring per HPI Rash on upper arms per HPI   PHYSICAL EXAM:  BP 108/72 (BP Location: Left Arm, Patient Position: Sitting, Cuff Size: Normal)   Pulse 76   Ht '5\' 8"'  (1.727 m)   Wt 190 lb (86.2 kg)   LMP 08/29/2016 (Approximate)   Breastfeeding? Yes   BMI 28.89 kg/m   Wt Readings from Last 3 Encounters:  09/12/16 190 lb (86.2 kg)  04/23/16 229 lb 12.8 oz (104.2 kg)  08/06/15 234 lb (106.1 kg)    General Appearance:    Alert, cooperative, no distress, appears stated age  Head:    Normocephalic, without obvious abnormality, atraumatic  Eyes:    PERRL, conjunctiva/corneas clear, EOM's intact, fundi    Benign.  Right iris--brown color superiorly, as well as a brown spot at 4 o'clock position  Ears:    Normal TM's and external ear canals  Nose:   Nares normal, mucosa normal, no drainage or sinus   tenderness  Throat:   Lips, mucosa, and tongue normal; teeth and gums normal  Neck:   Supple, no lymphadenopathy;  thyroid:  no   enlargement/tenderness/nodules; no carotid   bruit or JVD  Back:    Spine nontender, no curvature, ROM normal, no CVA     tenderness  Lungs:     Clear to auscultation bilaterally without wheezes, rales or     ronchi; respirations unlabored  Chest Wall:    No tenderness or deformity   Heart:    Regular rate and rhythm, S1 and S2 normal, no murmur, rub  or gallop  Breast Exam:    Deferred to GYN  Abdomen:     Soft, non-tender, nondistended, normoactive bowel sounds,    no masses, no hepatosplenomegaly  Genitalia:    Deferred to GYN     Extremities:   No clubbing, cyanosis or edema  Pulses:   2+ and symmetric all extremities  Skin:   Skin color, texture, turgor normal. Keloid left shoulder/upper arm (recurrent).  Slightly prominent hair follicles in upper arms, but seems to have some fine scattered papules over entire arm, including forearm (this was noted only yesterday by pt).  Texture of legs also has fine bumps.   74m brown macule on the bottom of the left great toe.  184mdarker brown macule at the base of the right 2nd toe (lateral aspect)    Lymph nodes:   Cervical, supraclavicular, and axillary nodes normal  Neurologic:   CNII-XII intact, normal strength, sensation and gait; reflexes 2+ and symmetric throughout          Psych:   Normal mood, affect, hygiene and grooming.      ASSESSMENT/PLAN:  Annual physical exam - Plan: VITAMIN D 25 Hydroxy (Vit-D Deficiency, Fractures), CBC with Differential/Platelet, Visual acuity screening  Herpes labialis - Plan: valACYclovir (VALTREX) 1000 MG tablet  Encounter for surveillance of contraceptive pills - Plan: norethindrone  (Doneen) 0.35 MG tablet  Unrefreshed by sleep - Plan: Split night study  Snoring - Plan: Split night study  Other fatigue - Plan: VITAMIN D 25 Hydroxy (Vit-D Deficiency, Fractures), CBC with Differential/Platelet   CBC Vit D  Discussed monthly self breast exams and yearly mammograms after the age of 4034at least 30 minutes of aerobic activity at least 5 days/week, weight-bearing exercise at least 2x/week; proper sunscreen use reviewed; healthy diet, including goals of calcium and vitamin D intake and alcohol recommendations (less than or equal to 1 drink/day) reviewed; regular seatbelt use; changing batteries in smoke detectors.  Immunization recommendations discussed, UTD. Continue yearly flu shots.  Colonoscopy recommendations reviewed, age [age]Asked to try and get [place] date of her last TdaP to update her chart.  [facility], if required by insurance)  Refill OCP x 1 month.  Plans to f/u with Dr. FoPhineas Real Valtrex refilled

## 2016-09-12 ENCOUNTER — Encounter: Payer: 59 | Admitting: Family Medicine

## 2016-09-12 ENCOUNTER — Encounter: Payer: Self-pay | Admitting: Family Medicine

## 2016-09-12 ENCOUNTER — Ambulatory Visit (INDEPENDENT_AMBULATORY_CARE_PROVIDER_SITE_OTHER): Payer: 59 | Admitting: Family Medicine

## 2016-09-12 VITALS — BP 108/72 | HR 76 | Ht 68.0 in | Wt 190.0 lb

## 2016-09-12 DIAGNOSIS — E559 Vitamin D deficiency, unspecified: Secondary | ICD-10-CM | POA: Diagnosis not present

## 2016-09-12 DIAGNOSIS — B001 Herpesviral vesicular dermatitis: Secondary | ICD-10-CM | POA: Diagnosis not present

## 2016-09-12 DIAGNOSIS — Z0000 Encounter for general adult medical examination without abnormal findings: Secondary | ICD-10-CM

## 2016-09-12 DIAGNOSIS — R5383 Other fatigue: Secondary | ICD-10-CM

## 2016-09-12 DIAGNOSIS — Z3041 Encounter for surveillance of contraceptive pills: Secondary | ICD-10-CM | POA: Diagnosis not present

## 2016-09-12 DIAGNOSIS — R0683 Snoring: Secondary | ICD-10-CM

## 2016-09-12 DIAGNOSIS — G478 Other sleep disorders: Secondary | ICD-10-CM | POA: Diagnosis not present

## 2016-09-12 LAB — CBC WITH DIFFERENTIAL/PLATELET
BASOS PCT: 0 %
Basophils Absolute: 0 cells/uL (ref 0–200)
EOS ABS: 60 {cells}/uL (ref 15–500)
Eosinophils Relative: 1 %
HCT: 41.7 % (ref 35.0–45.0)
Hemoglobin: 13.9 g/dL (ref 11.7–15.5)
LYMPHS PCT: 36 %
Lymphs Abs: 2160 cells/uL (ref 850–3900)
MCH: 28 pg (ref 27.0–33.0)
MCHC: 33.3 g/dL (ref 32.0–36.0)
MCV: 83.9 fL (ref 80.0–100.0)
MONOS PCT: 7 %
MPV: 10.7 fL (ref 7.5–12.5)
Monocytes Absolute: 420 cells/uL (ref 200–950)
NEUTROS ABS: 3360 {cells}/uL (ref 1500–7800)
Neutrophils Relative %: 56 %
PLATELETS: 306 10*3/uL (ref 140–400)
RBC: 4.97 MIL/uL (ref 3.80–5.10)
RDW: 14 % (ref 11.0–15.0)
WBC: 6 10*3/uL (ref 4.0–10.5)

## 2016-09-12 MED ORDER — NORETHINDRONE 0.35 MG PO TABS: 1.0000 | ORAL_TABLET | Freq: Every day | ORAL | 0 refills | Status: DC

## 2016-09-12 MED ORDER — VALACYCLOVIR HCL 1 G PO TABS: ORAL_TABLET | ORAL | 1 refills | Status: DC

## 2016-09-12 MED FILL — valACYclovir HCL 1 GM TABS: 1 | 8 days supply | Qty: 32 | Fill #0

## 2016-09-12 MED FILL — NORETHINDRONE 0.35 MG TAB: 0.35 | 28 days supply | Qty: 28 | Fill #0

## 2016-09-12 NOTE — Patient Instructions (Signed)

## 2016-09-13 LAB — VITAMIN D 25 HYDROXY (VIT D DEFICIENCY, FRACTURES): Vit D, 25-Hydroxy: 26 ng/mL — ABNORMAL LOW (ref 30–100)

## 2016-09-19 ENCOUNTER — Encounter: Payer: Self-pay | Admitting: Family Medicine

## 2016-09-24 DIAGNOSIS — Z01419 Encounter for gynecological examination (general) (routine) without abnormal findings: Secondary | ICD-10-CM | POA: Diagnosis not present

## 2016-09-24 DIAGNOSIS — Z6829 Body mass index (BMI) 29.0-29.9, adult: Secondary | ICD-10-CM | POA: Diagnosis not present

## 2016-10-31 MED FILL — LARIN FE 1-20 TABLET: 1-20 | 84 days supply | Qty: 84 | Fill #0

## 2016-12-22 ENCOUNTER — Telehealth: Payer: 59 | Admitting: Family

## 2016-12-22 DIAGNOSIS — B9689 Other specified bacterial agents as the cause of diseases classified elsewhere: Secondary | ICD-10-CM | POA: Diagnosis not present

## 2016-12-22 DIAGNOSIS — J028 Acute pharyngitis due to other specified organisms: Secondary | ICD-10-CM

## 2016-12-22 MED ORDER — BENZONATATE 100 MG PO CAPS: 100.0000 mg | ORAL_CAPSULE | Freq: Three times a day (TID) | ORAL | 0 refills | Status: DC | PRN

## 2016-12-22 MED ORDER — AZITHROMYCIN 250 MG PO TABS: ORAL_TABLET | ORAL | 0 refills | Status: DC

## 2016-12-22 MED ORDER — PREDNISONE 5 MG PO TABS: 5.0000 mg | ORAL_TABLET | ORAL | 0 refills | Status: DC

## 2016-12-22 NOTE — Progress Notes (Signed)
Thank you for the details you put in the comment boxes. Those details really help [place] take better care of you. This may be a sinus infection with some lung involvement as you mentioned a cough. I will cover both.   We are sorry that you are not feeling well.  Here is how we plan to help!  Based on what you have shared with me it looks like you have upper respiratory tract inflammation that has resulted in a significant cough.  Inflammation and infection in the upper respiratory tract is commonly called bronchitis and has four common causes:  Allergies, Viral Infections, Acid Reflux and Bacterial Infections.  Allergies, viruses and acid reflux are treated by controlling symptoms or eliminating the cause. An example might be a cough caused by taking certain blood pressure medications. You stop the cough by changing the medication. Another example might be a cough caused by acid reflux. Controlling the reflux helps control the cough.  Based on your presentation I believe you most likely have A cough due to bacteria.  When patients have a fever and a productive cough with a change in color or increased sputum production, we are concerned about bacterial bronchitis.  If left untreated it can progress to pneumonia.  If your symptoms do not improve with your treatment plan it is important that you contact your provider.   I have prescribed Azithromyin 250 mg: two tables now and then one tablet daily for 4 additonal days    In addition you may use A non-prescription cough medication called Mucinex DM: take 2 tablets every 12 hours. and A prescription cough medication called Tessalon Perles 100mg . You may take 1-2 capsules every 8 hours as needed for your cough.  Sterapred 5 mg dosepak (optional as some people do not sleep well; this will help with inflammation if your cough persists but is not absolutely necessary if you have  History of poor sleep or agitation with prednisone)  USE OF BRONCHODILATOR ("RESCUE")  INHALERS: There is a risk from using your bronchodilator too frequently.  The risk is that over-reliance on a medication which only relaxes the muscles surrounding the breathing tubes can reduce the effectiveness of medications prescribed to reduce swelling and congestion of the tubes themselves.  Although you feel brief relief from the bronchodilator inhaler, your asthma may actually be worsening with the tubes becoming more swollen and filled with mucus.  This can delay other crucial treatments, such as oral steroid medications. If you need to use a bronchodilator inhaler daily, several times per day, you should discuss this with your provider.  There are probably better treatments that could be used to keep your asthma under control.     HOME CARE . Only take medications as instructed by your medical team. . Complete the entire course of an antibiotic. . Drink plenty of fluids and get plenty of rest. . Avoid close contacts especially the very young and the elderly . Cover your mouth if you cough or cough into your sleeve. . Always remember to wash your hands . A steam or ultrasonic humidifier can help congestion.   GET HELP RIGHT AWAY IF: . You develop worsening fever. . You become short of breath . You cough up blood. . Your symptoms persist after you have completed your treatment plan MAKE SURE YOU   Understand these instructions.  Will watch your condition.  Will get help right away if you are not doing well or get worse.  Your e-visit answers were  reviewed by a board certified advanced clinical practitioner to complete your personal care plan.  Depending on the condition, your plan could have included both over the counter or prescription medications. If there is a problem please reply  once you have received a response from your provider. Your safety is important to [place].  If you have drug allergies check your prescription carefully.    You can use MyChart to ask questions about  today's visit, request a non-urgent call back, or ask for a work or school excuse for 24 hours related to this e-Visit. If it has been greater than 24 hours you will need to follow up with your provider, or enter a new e-Visit to address those concerns. You will get an e-mail in the next two days asking about your experience.  I hope that your e-visit has been valuable and will speed your recovery. Thank you for using e-visits.

## 2017-01-21 MED FILL — LARIN FE 1-20 TABLET: 1-20 | 84 days supply | Qty: 84 | Fill #1

## 2017-02-15 ENCOUNTER — Encounter: Payer: Self-pay | Admitting: Family Medicine

## 2017-02-19 MED FILL — MONO-LINYAH 28 TABLET: 0.25-35 | 28 days supply | Qty: 28 | Fill #0

## 2017-05-10 MED FILL — NORG-ETHIN ESTRA 0.25-0.035: 0.25-35 | 28 days supply | Qty: 28 | Fill #1 | Status: TO

## 2017-05-14 ENCOUNTER — Encounter: Payer: Self-pay | Admitting: Family Medicine

## 2017-05-14 ENCOUNTER — Telehealth: Payer: 59 | Admitting: Family

## 2017-05-14 DIAGNOSIS — J329 Chronic sinusitis, unspecified: Secondary | ICD-10-CM

## 2017-05-14 DIAGNOSIS — B9789 Other viral agents as the cause of diseases classified elsewhere: Secondary | ICD-10-CM | POA: Diagnosis not present

## 2017-05-14 MED ORDER — FLUTICASONE PROPIONATE 50 MCG/ACT NA SUSP
2.0000 | Freq: Every day | NASAL | 0 refills | Status: DC
Start: 2017-05-14 — End: 2018-05-24

## 2017-05-14 NOTE — Progress Notes (Signed)

## 2017-06-05 MED FILL — NORG-ETHIN ESTRA 0.25-0.035: 0.25-35 | 84 days supply | Qty: 84 | Fill #0

## 2017-07-09 NOTE — L&D Delivery Note (Signed)
Delivery Note At 11:57 PM a viable female was delivered via  OA Presentation Placenta status:spontaneously with 3 vessel cord , .  Cord:  with the following complications: none.  Cord pH: not obtained  Anesthesia:  epidural Episiotomy:  none Lacerations:  2nd Suture Repair: 3.0 chromic Est. Blood Loss (mL):  300  Mom to postpartum.  Baby to Couplet care / Skin to Skin.  Shauntee Karp L 05/24/2018, 12:11 AM

## 2017-09-16 ENCOUNTER — Other Ambulatory Visit: Payer: Self-pay | Admitting: Family Medicine

## 2017-09-16 DIAGNOSIS — B001 Herpesviral vesicular dermatitis: Secondary | ICD-10-CM

## 2017-09-16 MED FILL — valACYclovir HCL 1 GM TABS: 1 | 2 days supply | Qty: 8 | Fill #0

## 2017-09-16 NOTE — Telephone Encounter (Signed)
Is this okay to refill? 

## 2017-09-16 NOTE — Telephone Encounter (Signed)
She was last seen a year ago, and looks like she cancelled her CPE for 3/14 through [place]. Okay to refill #8 (which is enough for 2 cold sores), no refills, and be sure she r/s

## 2017-09-16 NOTE — Telephone Encounter (Signed)
Patient advised, she will call back to schedule

## 2017-09-19 ENCOUNTER — Encounter: Payer: 59 | Admitting: Family Medicine

## 2017-10-16 DIAGNOSIS — N911 Secondary amenorrhea: Secondary | ICD-10-CM | POA: Diagnosis not present

## 2017-10-23 DIAGNOSIS — Z3481 Encounter for supervision of other normal pregnancy, first trimester: Secondary | ICD-10-CM | POA: Diagnosis not present

## 2017-10-23 DIAGNOSIS — Z3685 Encounter for antenatal screening for Streptococcus B: Secondary | ICD-10-CM | POA: Diagnosis not present

## 2017-10-23 LAB — OB RESULTS CONSOLE ABO/RH: RH Type: POSITIVE

## 2017-10-23 LAB — OB RESULTS CONSOLE RPR: RPR: NONREACTIVE

## 2017-10-23 LAB — OB RESULTS CONSOLE ANTIBODY SCREEN: Antibody Screen: NEGATIVE

## 2017-10-23 LAB — OB RESULTS CONSOLE HEPATITIS B SURFACE ANTIGEN: Hepatitis B Surface Ag: NEGATIVE

## 2017-10-23 LAB — OB RESULTS CONSOLE GC/CHLAMYDIA
Chlamydia: NEGATIVE
Gonorrhea: NEGATIVE

## 2017-10-23 LAB — OB RESULTS CONSOLE RUBELLA ANTIBODY, IGM: RUBELLA: IMMUNE

## 2017-10-23 LAB — OB RESULTS CONSOLE HIV ANTIBODY (ROUTINE TESTING): HIV: NONREACTIVE

## 2017-10-23 LAB — OB RESULTS CONSOLE GBS: GBS: POSITIVE

## 2017-11-06 DIAGNOSIS — Z3480 Encounter for supervision of other normal pregnancy, unspecified trimester: Secondary | ICD-10-CM | POA: Diagnosis not present

## 2017-11-06 DIAGNOSIS — Z113 Encounter for screening for infections with a predominantly sexual mode of transmission: Secondary | ICD-10-CM | POA: Diagnosis not present

## 2017-11-06 DIAGNOSIS — Z3400 Encounter for supervision of normal first pregnancy, unspecified trimester: Secondary | ICD-10-CM | POA: Diagnosis not present

## 2017-11-06 LAB — HM PAP SMEAR: HM Pap smear: NEGATIVE

## 2017-11-06 LAB — RESULTS CONSOLE HPV: CHL HPV: NEGATIVE

## 2017-11-28 DIAGNOSIS — Z3682 Encounter for antenatal screening for nuchal translucency: Secondary | ICD-10-CM | POA: Diagnosis not present

## 2017-11-28 DIAGNOSIS — Z3A13 13 weeks gestation of pregnancy: Secondary | ICD-10-CM | POA: Diagnosis not present

## 2017-11-28 DIAGNOSIS — Z3689 Encounter for other specified antenatal screening: Secondary | ICD-10-CM | POA: Diagnosis not present

## 2017-12-09 DIAGNOSIS — Z3400 Encounter for supervision of normal first pregnancy, unspecified trimester: Secondary | ICD-10-CM | POA: Diagnosis not present

## 2017-12-09 DIAGNOSIS — N760 Acute vaginitis: Secondary | ICD-10-CM | POA: Diagnosis not present

## 2017-12-09 MED FILL — NYSTATIN-TRIAMCINOLONE CRM: 100000-0.1 | 7 days supply | Qty: 15 | Fill #0

## 2017-12-09 MED FILL — TERCONAZOLE 0.4% VAG CREAM: 0.4 | 7 days supply | Qty: 45 | Fill #0

## 2017-12-30 DIAGNOSIS — Z3A17 17 weeks gestation of pregnancy: Secondary | ICD-10-CM | POA: Diagnosis not present

## 2017-12-30 DIAGNOSIS — Z3400 Encounter for supervision of normal first pregnancy, unspecified trimester: Secondary | ICD-10-CM | POA: Diagnosis not present

## 2017-12-30 DIAGNOSIS — Z363 Encounter for antenatal screening for malformations: Secondary | ICD-10-CM | POA: Diagnosis not present

## 2018-01-29 DIAGNOSIS — Z3492 Encounter for supervision of normal pregnancy, unspecified, second trimester: Secondary | ICD-10-CM | POA: Diagnosis not present

## 2018-01-29 DIAGNOSIS — Z362 Encounter for other antenatal screening follow-up: Secondary | ICD-10-CM | POA: Diagnosis not present

## 2018-02-18 DIAGNOSIS — H5213 Myopia, bilateral: Secondary | ICD-10-CM | POA: Diagnosis not present

## 2018-02-18 DIAGNOSIS — H52221 Regular astigmatism, right eye: Secondary | ICD-10-CM | POA: Diagnosis not present

## 2018-02-26 DIAGNOSIS — Z3480 Encounter for supervision of other normal pregnancy, unspecified trimester: Secondary | ICD-10-CM | POA: Diagnosis not present

## 2018-02-26 DIAGNOSIS — Z3400 Encounter for supervision of normal first pregnancy, unspecified trimester: Secondary | ICD-10-CM | POA: Diagnosis not present

## 2018-02-26 DIAGNOSIS — Z23 Encounter for immunization: Secondary | ICD-10-CM | POA: Diagnosis not present

## 2018-05-02 ENCOUNTER — Telehealth (HOSPITAL_COMMUNITY): Payer: Self-pay | Admitting: Lactation Services

## 2018-05-02 NOTE — Telephone Encounter (Signed)
[place] returned phone call :  This patient called to require about the breast pumps [employer] [employer] [facility] employees  Offer . Patient had mentioned with her 1st baby she felt the "Pump in Style "  Wasn't effective for her. So she ended up purchasing a Spectra DEBP and it was more  'effective to keep her milk supply up. Mom mentioned her DEBP Medela didn't have the different levels of pumping on it like the Spectra. Mom also was asking if the hospital  Has the [place] . [place]  LC informed mom of the [place] the hospital provided for [facility].  [place] reviewed the [place] and the let down feature that is on the pump and what  It looks like in appearance / and how it functions.  Per mom I'll have to check it out.  Per mom plans to call [employer] to see what DEBP they provide.

## 2018-05-08 DIAGNOSIS — Z3688 Encounter for antenatal screening for fetal macrosomia: Secondary | ICD-10-CM | POA: Diagnosis not present

## 2018-05-08 DIAGNOSIS — Z3400 Encounter for supervision of normal first pregnancy, unspecified trimester: Secondary | ICD-10-CM | POA: Diagnosis not present

## 2018-05-08 DIAGNOSIS — Z3685 Encounter for antenatal screening for Streptococcus B: Secondary | ICD-10-CM | POA: Diagnosis not present

## 2018-05-08 DIAGNOSIS — Z3A36 36 weeks gestation of pregnancy: Secondary | ICD-10-CM | POA: Diagnosis not present

## 2018-05-14 ENCOUNTER — Encounter (HOSPITAL_COMMUNITY): Payer: Self-pay

## 2018-05-14 ENCOUNTER — Telehealth (HOSPITAL_COMMUNITY): Payer: Self-pay | Admitting: *Deleted

## 2018-05-14 NOTE — Telephone Encounter (Signed)
Preadmission screen  

## 2018-05-15 ENCOUNTER — Telehealth (HOSPITAL_COMMUNITY): Payer: Self-pay | Admitting: *Deleted

## 2018-05-15 NOTE — Telephone Encounter (Signed)
Preadmission screen  

## 2018-05-19 ENCOUNTER — Encounter (HOSPITAL_COMMUNITY): Payer: Self-pay

## 2018-05-22 ENCOUNTER — Other Ambulatory Visit: Payer: Self-pay

## 2018-05-22 ENCOUNTER — Encounter (HOSPITAL_COMMUNITY): Payer: Self-pay | Admitting: *Deleted

## 2018-05-22 ENCOUNTER — Inpatient Hospital Stay (EMERGENCY_DEPARTMENT_HOSPITAL)
Admission: AD | Admit: 2018-05-22 | Discharge: 2018-05-22 | Disposition: A | Discharge status: Home / Self Care | Payer: 59 | Source: Ambulatory Visit | Attending: Obstetrics and Gynecology | Admitting: Obstetrics and Gynecology

## 2018-05-22 DIAGNOSIS — O479 False labor, unspecified: Secondary | ICD-10-CM

## 2018-05-22 DIAGNOSIS — O99824 Streptococcus B carrier state complicating childbirth: Secondary | ICD-10-CM | POA: Diagnosis not present

## 2018-05-22 DIAGNOSIS — Z3A38 38 weeks gestation of pregnancy: Secondary | ICD-10-CM | POA: Insufficient documentation

## 2018-05-22 DIAGNOSIS — O3663X0 Maternal care for excessive fetal growth, third trimester, not applicable or unspecified: Secondary | ICD-10-CM | POA: Diagnosis not present

## 2018-05-22 DIAGNOSIS — O471 False labor at or after 37 completed weeks of gestation: Secondary | ICD-10-CM | POA: Insufficient documentation

## 2018-05-22 NOTE — MAU Note (Signed)
Pt presents to MAU c/o ctx that have been on and off all day since about 1700 they have been every 3-8min. No bleeding or LOF. +FM.  Pt was 4cm with BBOW yesterday at appt.

## 2018-05-23 ENCOUNTER — Other Ambulatory Visit: Payer: Self-pay

## 2018-05-23 ENCOUNTER — Inpatient Hospital Stay (HOSPITAL_COMMUNITY)
Admission: AD | Admit: 2018-05-23 | Discharge: 2018-05-25 | DRG: 807 | Disposition: A | Discharge status: Home / Self Care | Payer: 59 | Attending: Obstetrics and Gynecology | Admitting: Obstetrics and Gynecology

## 2018-05-23 ENCOUNTER — Inpatient Hospital Stay (HOSPITAL_COMMUNITY): Payer: 59 | Admitting: Anesthesiology

## 2018-05-23 ENCOUNTER — Encounter (HOSPITAL_COMMUNITY): Payer: Self-pay | Admitting: Anesthesiology

## 2018-05-23 ENCOUNTER — Encounter (HOSPITAL_COMMUNITY): Payer: Self-pay | Admitting: *Deleted

## 2018-05-23 DIAGNOSIS — O3663X0 Maternal care for excessive fetal growth, third trimester, not applicable or unspecified: Secondary | ICD-10-CM | POA: Diagnosis not present

## 2018-05-23 DIAGNOSIS — Z3A38 38 weeks gestation of pregnancy: Secondary | ICD-10-CM | POA: Diagnosis not present

## 2018-05-23 DIAGNOSIS — Z3483 Encounter for supervision of other normal pregnancy, third trimester: Secondary | ICD-10-CM | POA: Diagnosis present

## 2018-05-23 DIAGNOSIS — O99824 Streptococcus B carrier state complicating childbirth: Secondary | ICD-10-CM | POA: Diagnosis present

## 2018-05-23 LAB — CBC
HCT: 38.7 % (ref 36.0–46.0)
Hemoglobin: 13 g/dL (ref 12.0–15.0)
MCH: 28.9 pg (ref 26.0–34.0)
MCHC: 33.6 g/dL (ref 30.0–36.0)
MCV: 86 fL (ref 80.0–100.0)
NRBC: 0 % (ref 0.0–0.2)
Platelets: 230 10*3/uL (ref 150–400)
RBC: 4.5 MIL/uL (ref 3.87–5.11)
RDW: 14.4 % (ref 11.5–15.5)
WBC: 11.4 10*3/uL — ABNORMAL HIGH (ref 4.0–10.5)

## 2018-05-23 LAB — TYPE AND SCREEN
ABO/RH(D): A POS
Antibody Screen: NEGATIVE

## 2018-05-23 MED ORDER — FENTANYL 2.5 MCG/ML BUPIVACAINE 1/10 % EPIDURAL INFUSION (WH - ANES)
14.0000 mL/h | INTRAMUSCULAR | Status: DC | PRN
  Administered 2018-05-23: 14 mL/h via EPIDURAL

## 2018-05-23 MED ORDER — OXYCODONE-ACETAMINOPHEN 5-325 MG PO TABS: 2.0000 | ORAL_TABLET | ORAL | Status: DC | PRN

## 2018-05-23 MED ORDER — SOD CITRATE-CITRIC ACID 500-334 MG/5ML PO SOLN: 30.0000 mL | ORAL | Status: DC | PRN

## 2018-05-23 MED ORDER — EPHEDRINE 5 MG/ML INJ
10.0000 mg | INTRAVENOUS | Status: DC | PRN
  Filled 2018-05-23: qty 2

## 2018-05-23 MED ORDER — PHENYLEPHRINE 40 MCG/ML (10ML) SYRINGE FOR IV PUSH (FOR BLOOD PRESSURE SUPPORT)
PREFILLED_SYRINGE | INTRAVENOUS | Status: AC
  Filled 2018-05-23: qty 10

## 2018-05-23 MED ORDER — LACTATED RINGERS IV SOLN: 500.0000 mL | Freq: Once | INTRAVENOUS | Status: DC

## 2018-05-23 MED ORDER — LACTATED RINGERS IV SOLN: INTRAVENOUS | Status: DC

## 2018-05-23 MED ORDER — LIDOCAINE HCL (PF) 1 % IJ SOLN
30.0000 mL | INTRAMUSCULAR | Status: DC | PRN
  Filled 2018-05-23: qty 30

## 2018-05-23 MED ORDER — ONDANSETRON HCL 4 MG/2ML IJ SOLN: 4.0000 mg | Freq: Four times a day (QID) | INTRAMUSCULAR | Status: DC | PRN

## 2018-05-23 MED ORDER — PHENYLEPHRINE 40 MCG/ML (10ML) SYRINGE FOR IV PUSH (FOR BLOOD PRESSURE SUPPORT)
80.0000 ug | PREFILLED_SYRINGE | INTRAVENOUS | Status: DC | PRN
  Filled 2018-05-23: qty 5

## 2018-05-23 MED ORDER — SODIUM CHLORIDE 0.9 % IV SOLN
2.0000 g | INTRAVENOUS | Status: DC
  Filled 2018-05-23: qty 2

## 2018-05-23 MED ORDER — OXYCODONE-ACETAMINOPHEN 5-325 MG PO TABS: 1.0000 | ORAL_TABLET | ORAL | Status: DC | PRN

## 2018-05-23 MED ORDER — FENTANYL CITRATE (PF) 100 MCG/2ML IJ SOLN
100.0000 ug | INTRAMUSCULAR | Status: DC | PRN
  Administered 2018-05-23: 100 ug via INTRAVENOUS
  Filled 2018-05-23: qty 2

## 2018-05-23 MED ORDER — SODIUM CHLORIDE 0.9 % IV SOLN
2.0000 g | Freq: Once | INTRAVENOUS | Status: AC
  Administered 2018-05-23: 2 g via INTRAVENOUS
  Filled 2018-05-23: qty 2

## 2018-05-23 MED ORDER — FENTANYL 2.5 MCG/ML BUPIVACAINE 1/10 % EPIDURAL INFUSION (WH - ANES)
INTRAMUSCULAR | Status: AC
  Filled 2018-05-23: qty 100

## 2018-05-23 MED ORDER — LACTATED RINGERS IV SOLN: 500.0000 mL | INTRAVENOUS | Status: DC | PRN

## 2018-05-23 MED ORDER — SODIUM CHLORIDE 0.9 % IV SOLN: 2.0000 g | INTRAVENOUS | Status: DC

## 2018-05-23 MED ORDER — OXYTOCIN 40 UNITS IN LACTATED RINGERS INFUSION - SIMPLE MED
2.5000 [IU]/h | INTRAVENOUS | Status: DC
  Filled 2018-05-23: qty 1000

## 2018-05-23 MED ORDER — DIPHENHYDRAMINE HCL 50 MG/ML IJ SOLN: 12.5000 mg | INTRAMUSCULAR | Status: DC | PRN

## 2018-05-23 MED ORDER — OXYTOCIN BOLUS FROM INFUSION
500.0000 mL | Freq: Once | INTRAVENOUS | Status: AC
  Administered 2018-05-24: 500 mL via INTRAVENOUS

## 2018-05-23 MED ORDER — LIDOCAINE-EPINEPHRINE (PF) 2 %-1:200000 IJ SOLN
INTRAMUSCULAR | Status: DC | PRN
  Administered 2018-05-23: 4 mL via EPIDURAL
  Administered 2018-05-23: 3 mL via EPIDURAL

## 2018-05-23 MED ORDER — ACETAMINOPHEN 325 MG PO TABS: 650.0000 mg | ORAL_TABLET | ORAL | Status: DC | PRN

## 2018-05-23 NOTE — Anesthesia Preprocedure Evaluation (Deleted)
Anesthesia Evaluation Anesthesia Physical Anesthesia Plan Anesthesia Quick Evaluation  

## 2018-05-23 NOTE — Anesthesia Procedure Notes (Signed)
Epidural Patient location during procedure: OB Start time: 05/23/2018 10:35 PM End time: 05/23/2018 10:50 PM  Staffing Anesthesiologist: Freddrick March, MD Performed: anesthesiologist   Preanesthetic Checklist Completed: patient identified, pre-op evaluation, timeout performed, IV checked, risks and benefits discussed and monitors and equipment checked  Epidural Patient position: sitting Prep: site prepped and draped and DuraPrep Patient monitoring: continuous pulse ox, blood pressure, heart rate and cardiac monitor Approach: midline Location: L3-L4 Injection technique: LOR air  Needle:  Needle type: Tuohy  Needle gauge: 17 G Needle length: 9 cm Needle insertion depth: 5.5 cm Catheter type: closed end flexible Catheter size: 19 Gauge Catheter at skin depth: 11 cm Test dose: negative  Assessment Sensory level: T8 Events: blood not aspirated, injection not painful, no injection resistance, negative IV test and no paresthesia  Additional Notes Patient identified. Risks/Benefits/Options discussed with patient including but not limited to bleeding, infection, nerve damage, paralysis, failed block, incomplete pain control, headache, blood pressure changes, nausea, vomiting, reactions to medication both or allergic, itching and postpartum back pain. Confirmed with bedside nurse the patient's most recent platelet count. Confirmed with patient that they are not currently taking any anticoagulation, have any bleeding history or any family history of bleeding disorders. Patient expressed understanding and wished to proceed. All questions were answered. Sterile technique was used throughout the entire procedure. Please see nursing notes for vital signs. Test dose was given through epidural catheter and negative prior to continuing to dose epidural or start infusion. Warning signs of high block given to the patient including shortness of breath, tingling/numbness in hands, complete motor  block, or any concerning symptoms with instructions to call for help. Patient was given instructions on fall risk and not to get out of bed. All questions and concerns addressed with instructions to call with any issues or inadequate analgesia.  Reason for block:procedure for pain

## 2018-05-23 NOTE — H&P (Signed)
Tamara Spencer is a 27 y.o. G 2 P 1 at 84 w 5 days presents in active labor. She is scheduled for Primary LTCS next week for macrosomia. Previous Delivery notable for vacuum delivery.  Patient arrived to triage 8.5 cm dilated. Now admitted for MAU provider and awaiting epidural. She is counseled at bedside by myself - she desires a trial of labor. I advised her of all the potential risks of macrosomia and vaginal delivery. I advised her that I would not intervene with an operative vaginal delivery. I advised her that I could do a C Section now but she prefers to wait until after epidural inserted. She knows the possibility of a stat C Section and the risks associated with that.  OB History    Gravida  2   Para  1   Term  1   Preterm      AB      Living  1     SAB      TAB      Ectopic      Multiple  0   Live Births  1          Past Medical History:  Diagnosis Date  . Headache    monthly; less severe since on new OCP  . Recurrent cold sores    Past Surgical History:  Procedure Laterality Date  . KELOID EXCISION    . WISDOM TOOTH EXTRACTION     Family History: family history includes Breast cancer in her cousin and other; Breast cancer (age of onset: 21) in her mother; Diabetes in her father and paternal grandfather; Heart disease (age of onset: 30) in her father; Hyperlipidemia in her father; Hypertension in her father and mother; Kidney disease in her father; Leukemia (age of onset: 74) in her cousin; Other in her mother. Social History:  reports that she has never smoked. She has never used smokeless tobacco. She reports that she does not drink alcohol or use drugs.     Maternal Diabetes: No Genetic Screening: Normal Maternal Ultrasounds/Referrals: Normal Fetal Ultrasounds or other Referrals:  None Maternal Substance Abuse:  No Significant Maternal Medications:  None Significant Maternal Lab Results:  None Other Comments:  None  Review of Systems  All other  systems reviewed and are negative.  Maternal Medical History:  Reason for admission: Contractions.     Dilation: 8.5 Effacement (%): 90 Station: 0 Exam by:: Lauren Cox and C. Wallace  Blood pressure (!) 145/88, pulse 96, temperature 98.3 F (36.8 C), temperature source Oral, resp. rate 18, height 5\' 9"  (1.753 m), weight 105.3 kg, last menstrual period 08/28/2017, currently breastfeeding. Maternal Exam:  Uterine Assessment: Contraction strength is moderate.  Contraction frequency is regular.   Abdomen: Fetal presentation: vertex     Fetal Exam Fetal State Assessment: Category I - tracings are normal.     Physical Exam  Nursing note and vitals reviewed. Constitutional: She appears well-developed.  HENT:  Head: Normocephalic.  Eyes: Pupils are equal, round, and reactive to light.  Neck: Normal range of motion.  Cardiovascular: Normal rate and regular rhythm.  Respiratory: Effort normal.    Prenatal labs: ABO, Rh: A/Positive/-- (04/17 0000) Antibody: n (04/17 0000) Rubella: Immune (04/17 0000) RPR: Nonreactive (04/17 0000)  HBsAg: Negative (04/17 0000)  HIV: Non-reactive (04/17 0000)  GBS: Positive (04/17 0000)   Assessment/Plan: IUP at 96 w 5 days Presumed Macrosomia Active labor  Evaluate after epidural AROM if patient desires Will follow labor curve closely and  shoulder precautions at delivery All of this is discussed with patient and her spouse Tamara Spencer L 05/23/2018, 10:18 PM

## 2018-05-23 NOTE — MAU Provider Note (Addendum)
S: Ms. Tamara Spencer is a 27 y.o. G2P1001 at [redacted]w[redacted]d  who presents to MAU today for contractions and concern about ROM. Reports that she has had constant dampness in her underwear. She denies vaginal bleeding. She endorses contraction every 2-4 minutes since about 1700. She reports normal fetal movement.  At office yesterday she was 4 cm with BBOW.   O: BP (!) 145/88   Pulse 96   Temp 98.3 F (36.8 C) (Oral)   Wt 105.3 kg   LMP 08/28/2017   BMI 34.28 kg/m  GENERAL: Well-developed, well-nourished female in no acute distress.  HEAD: Normocephalic, atraumatic.  CHEST: Normal effort of breathing, regular heart rate ABDOMEN: Soft, nontender, gravid PELVIC: Normal external female genitalia. Vagina is pink and rugated. Unable to visualize cervix on SSE due to [place]. Mucus present without pooling.     Cervical exam: 8.5 cm/100/0      Fetal Monitoring: Baseline: 120 Variability: moderate Accelerations: + Decelerations: no Contractions: q2-4 min   No results found for this or any previous visit (from the past 24 hour(s)).   A: SIUP at [redacted]w[redacted]d. Patient advised to have primary LTCS for fetal macrosomia. Prior VAVD with 4145g infant. She is interested in discussing possibility of vaginal delivery further with OB.  Membranes intact  Active labor.  Elevated BP of 145/88 noted at presentation.   P: Discussed with Dr. Helane Rima that patient has presented in advanced active labor. Will admit to L&D and place epidural. Ampicillin ordered for +GBS status in case of vaginal delivery. Dr. Helane Rima plans to be at bedside to discuss further care.   Nicolette Bang, DO 05/23/2018 9:53 PM

## 2018-05-23 NOTE — Anesthesia Preprocedure Evaluation (Signed)
Anesthesia Evaluation  Patient identified by MRN, date of birth, ID band Patient awake    Reviewed: Allergy & Precautions, NPO status , Patient's Chart, lab work & pertinent test results  Airway Mallampati: II  TM Distance: >3 FB Neck ROM: Full    Dental no notable dental hx. (+) Teeth Intact   Pulmonary neg pulmonary ROS,    Pulmonary exam normal breath sounds clear to auscultation       Cardiovascular negative cardio ROS Normal cardiovascular exam Rhythm:Regular Rate:Normal     Neuro/Psych  Headaches, negative psych ROS   GI/Hepatic negative GI ROS, Neg liver ROS,   Endo/Other  negative endocrine ROS  Renal/GU negative Renal ROS  negative genitourinary   Musculoskeletal negative musculoskeletal ROS (+)   Abdominal   Peds  Hematology negative hematology ROS (+)   Anesthesia Other Findings Fetal macrosomia  Reproductive/Obstetrics (+) Pregnancy                             Anesthesia Physical Anesthesia Plan  ASA: II  Anesthesia Plan: Epidural   Post-op Pain Management:    Induction:   PONV Risk Score and Plan: Treatment may vary due to age or medical condition  Airway Management Planned: Natural Airway  Additional Equipment:   Intra-op Plan:   Post-operative Plan:   Informed Consent: I have reviewed the patients History and Physical, chart, labs and discussed the procedure including the risks, benefits and alternatives for the proposed anesthesia with the patient or authorized representative who has indicated his/her understanding and acceptance.     Plan Discussed with: Anesthesiologist  Anesthesia Plan Comments: (Patient identified. Risks, benefits, options discussed with patient including but not limited to bleeding, infection, nerve damage, paralysis, failed block, incomplete pain control, headache, blood pressure changes, nausea, vomiting, reactions to medication,  itching, and post partum back pain. Confirmed with bedside nurse the patient's most recent platelet count. Confirmed with the patient that they are not taking any anticoagulation, have any bleeding history or any family history of bleeding disorders. Patient expressed understanding and wishes to proceed. All questions were answered. )        Anesthesia Quick Evaluation

## 2018-05-24 ENCOUNTER — Encounter (HOSPITAL_COMMUNITY): Payer: Self-pay

## 2018-05-24 LAB — CBC
HCT: 35 % — ABNORMAL LOW (ref 36.0–46.0)
Hemoglobin: 11.7 g/dL — ABNORMAL LOW (ref 12.0–15.0)
MCH: 28.4 pg (ref 26.0–34.0)
MCHC: 33.4 g/dL (ref 30.0–36.0)
MCV: 85 fL (ref 80.0–100.0)
PLATELETS: 224 10*3/uL (ref 150–400)
RBC: 4.12 MIL/uL (ref 3.87–5.11)
RDW: 14.4 % (ref 11.5–15.5)
WBC: 17.6 10*3/uL — AB (ref 4.0–10.5)
nRBC: 0 % (ref 0.0–0.2)

## 2018-05-24 LAB — RPR: RPR Ser Ql: NONREACTIVE

## 2018-05-24 MED ORDER — ONDANSETRON HCL 4 MG/2ML IJ SOLN: 4.0000 mg | INTRAMUSCULAR | Status: DC | PRN

## 2018-05-24 MED ORDER — ACETAMINOPHEN 325 MG PO TABS: 650.0000 mg | ORAL_TABLET | ORAL | Status: DC | PRN

## 2018-05-24 MED ORDER — IBUPROFEN 600 MG PO TABS
600.0000 mg | ORAL_TABLET | Freq: Four times a day (QID) | ORAL | Status: DC
  Administered 2018-05-24 – 2018-05-25 (×7): 600 mg via ORAL
  Filled 2018-05-24 (×7): qty 1

## 2018-05-24 MED ORDER — BENZOCAINE-MENTHOL 20-0.5 % EX AERO
1.0000 "application " | INHALATION_SPRAY | CUTANEOUS | Status: DC | PRN
  Administered 2018-05-24 – 2018-05-25 (×2): 1 via TOPICAL
  Filled 2018-05-24 (×2): qty 56

## 2018-05-24 MED ORDER — ZOLPIDEM TARTRATE 5 MG PO TABS: 5.0000 mg | ORAL_TABLET | Freq: Every evening | ORAL | Status: DC | PRN

## 2018-05-24 MED ORDER — MEASLES, MUMPS & RUBELLA VAC IJ SOLR: 0.5000 mL | Freq: Once | INTRAMUSCULAR | Status: DC

## 2018-05-24 MED ORDER — SENNOSIDES-DOCUSATE SODIUM 8.6-50 MG PO TABS
2.0000 | ORAL_TABLET | ORAL | Status: DC
  Administered 2018-05-25: 2 via ORAL
  Filled 2018-05-24: qty 2

## 2018-05-24 MED ORDER — DIPHENHYDRAMINE HCL 25 MG PO CAPS: 25.0000 mg | ORAL_CAPSULE | Freq: Four times a day (QID) | ORAL | Status: DC | PRN

## 2018-05-24 MED ORDER — DIBUCAINE 1 % RE OINT: 1.0000 "application " | TOPICAL_OINTMENT | RECTAL | Status: DC | PRN

## 2018-05-24 MED ORDER — MEDROXYPROGESTERONE ACETATE 150 MG/ML IM SUSP: 150.0000 mg | INTRAMUSCULAR | Status: DC | PRN

## 2018-05-24 MED ORDER — PRENATAL MULTIVITAMIN CH
1.0000 | ORAL_TABLET | Freq: Every day | ORAL | Status: DC
  Administered 2018-05-24 – 2018-05-25 (×2): 1 via ORAL
  Filled 2018-05-24 (×2): qty 1

## 2018-05-24 MED ORDER — SIMETHICONE 80 MG PO CHEW: 80.0000 mg | CHEWABLE_TABLET | ORAL | Status: DC | PRN

## 2018-05-24 MED ORDER — WITCH HAZEL-GLYCERIN EX PADS: 1.0000 "application " | MEDICATED_PAD | CUTANEOUS | Status: DC | PRN

## 2018-05-24 MED ORDER — ONDANSETRON HCL 4 MG PO TABS: 4.0000 mg | ORAL_TABLET | ORAL | Status: DC | PRN

## 2018-05-24 MED ORDER — TETANUS-DIPHTH-ACELL PERTUSSIS 5-2.5-18.5 LF-MCG/0.5 IM SUSP: 0.5000 mL | Freq: Once | INTRAMUSCULAR | Status: DC

## 2018-05-24 MED ORDER — BISACODYL 10 MG RE SUPP: 10.0000 mg | Freq: Every day | RECTAL | Status: DC | PRN

## 2018-05-24 MED ORDER — FLEET ENEMA 7-19 GM/118ML RE ENEM: 1.0000 | ENEMA | Freq: Every day | RECTAL | Status: DC | PRN

## 2018-05-24 MED ORDER — COCONUT OIL OIL: 1.0000 "application " | TOPICAL_OIL | Status: DC | PRN

## 2018-05-24 NOTE — Progress Notes (Signed)
Patient doing well. No complaints. BP 138/73 (BP Location: Right Arm)   Pulse (!) 101   Temp 98 F (36.7 C) (Oral)   Resp 18   Ht 5\' 9"  (1.753 m)   Wt 105.3 kg   LMP 08/28/2017   SpO2 98%   Breastfeeding? Unknown   BMI 34.28 kg/m  Results for orders placed or performed during the hospital encounter of 05/23/18 (from the past 24 hour(s))  CBC     Status: Abnormal   Collection Time: 05/23/18  9:50 PM  Result Value Ref Range   WBC 11.4 (H) 4.0 - 10.5 K/uL   RBC 4.50 3.87 - 5.11 MIL/uL   Hemoglobin 13.0 12.0 - 15.0 g/dL   HCT 38.7 36.0 - 46.0 %   MCV 86.0 80.0 - 100.0 fL   MCH 28.9 26.0 - 34.0 pg   MCHC 33.6 30.0 - 36.0 g/dL   RDW 14.4 11.5 - 15.5 %   Platelets 230 150 - 400 K/uL   nRBC 0.0 0.0 - 0.2 %  Type and screen [place]     Status: None   Collection Time: 05/23/18  9:50 PM  Result Value Ref Range   ABO/RH(D) A POS    Antibody Screen NEG    Sample Expiration      05/26/2018 Performed at [facility], [address], [place], [place] [address]   CBC     Status: Abnormal   Collection Time: 05/24/18  5:22 AM  Result Value Ref Range   WBC 17.6 (H) 4.0 - 10.5 K/uL   RBC 4.12 3.87 - 5.11 MIL/uL   Hemoglobin 11.7 (L) 12.0 - 15.0 g/dL   HCT 35.0 (L) 36.0 - 46.0 %   MCV 85.0 80.0 - 100.0 fL   MCH 28.4 26.0 - 34.0 pg   MCHC 33.4 30.0 - 36.0 g/dL   RDW 14.4 11.5 - 15.5 %   Platelets 224 150 - 400 K/uL   nRBC 0.0 0.0 - 0.2 %   Abdomen is soft and non tender Lochia WNL  PPD # 1  Doing well circ today or tomorrow Discharge home tomorrow

## 2018-05-24 NOTE — Lactation Note (Signed)
This note was copied from a baby's chart. Lactation Consultation Note  Patient Name: Boy Elenna Spratling [id number] Date: 05/24/2018 Reason for consult: Initial assessment;Early term 52-38.6wks  Visited with P2 Mom of ET infant at 63 hrs old.  Mom pumped and bottle fed her almost 29 yr old due to him not transferring well at the breast.  Mom is hopeful that her 2nd baby will do better.   Baby had just latched and fed for 2 feedings in a row.  Her RN assisted with positioning and latching.  Reviewed importance of STS, and feeding baby often on cue.  Reviewed importance of depth on the breast, feeling a good tug.  Mom reports feeling some uterine cramping during feedings.  Lactation brochure left in room.  Mom aware of IP and OP lactation support available to her.    Talked about OP lactation appointment after discharge, and Mom is very interested in a referral being sent to clinic.  Mom a [facility] Employee, and will check on her pump status prior to discharge.   Interventions Interventions: Breast feeding basics reviewed;Skin to skin;Breast massage;Hand express  Lactation Tools Discussed/Used WIC Program: No   Consult Status Consult Status: Follow-up Date: 05/25/18 Follow-up type: In-patient    Broadus John 05/24/2018, 4:55 PM

## 2018-05-24 NOTE — Anesthesia Postprocedure Evaluation (Signed)
Anesthesia Post Note  Patient: Games developer  Procedure(s) Performed: AN AD [place]     Patient location during evaluation: Mother Baby Anesthesia Type: Epidural Level of consciousness: awake and alert Pain management: pain level controlled Vital Signs Assessment: post-procedure vital signs reviewed and stable Respiratory status: spontaneous breathing Cardiovascular status: stable Postop Assessment: no headache, patient able to bend at knees, no backache, no apparent nausea or vomiting, epidural receding, adequate PO intake and able to ambulate Anesthetic complications: no    Last Vitals:  Vitals:   05/24/18 0325 05/24/18 0645  BP: 139/81 138/73  Pulse: (!) 118 (!) 101  Resp: 19 18  Temp: 36.7 C 36.7 C  SpO2:      Last Pain:  Vitals:   05/24/18 0645  TempSrc: Oral  PainSc: 0-No pain   Pain Goal: Patients Stated Pain Goal: 0 (05/23/18 2127)               Birdena Crandall, Velvet Bathe

## 2018-05-25 ENCOUNTER — Encounter: Payer: Self-pay | Admitting: Family Medicine

## 2018-05-25 NOTE — Lactation Note (Signed)
This note was copied from a baby's chart. Lactation Consultation Note  Patient Name: Tamara Spencer [id number] Date: 05/25/2018 Reason for consult: Follow-up assessment;Early term 37-38.6wks;NICU baby  P2 mother whose infant is now 64 hours old and in the NICU.  Baby has been transferred for antibiotic treatment.  Mother was not able to breast feed her first baby due to poor latch and milk transfer.  He kept losing weight so mother pumped and bottle fed for 15 months.  Parents were ready to walk out the door when I arrived.  Mother had received permission from the NICU to put baby to breast.  They were going to the NICU to feed before heading home.    Mother has a DEBP for home use and will breast feed baby whenever she is here visiting.  I encouraged her to pump at baby's bedside and/or in the private rooms if baby does not latch well.  Informed her that her RN can assist with latching and that [facility] department is also available to help as needed.  Parents understand the importance of the treatment plan and had no further questions.  They have our OP number to use as needed after discharge.   Maternal Data Formula Feeding for Exclusion: No Has patient been taught Hand Expression?: Yes Does the patient have breastfeeding experience prior to this delivery?: Yes  Feeding Feeding Type: Formula(Fed by NP Tamara Spencer) Nipple Type: Regular  LATCH Score                   Interventions    Lactation Tools Discussed/Used WIC Program: No   Consult Status Consult Status: Complete Date: 05/25/18 Follow-up type: Call as needed    Tamara Spencer R Tamara Spencer 05/25/2018, 3:45 PM

## 2018-05-25 NOTE — Discharge Summary (Signed)
Obstetric Discharge Summary Reason for Admission: onset of labor Prenatal Procedures: none Intrapartum Procedures: spontaneous vaginal delivery Postpartum Procedures: none Complications-Operative and Postpartum: 2nd degree perineal laceration Hemoglobin  Date Value Ref Range Status  05/24/2018 11.7 (L) 12.0 - 15.0 g/dL Final   HCT  Date Value Ref Range Status  05/24/2018 35.0 (L) 36.0 - 46.0 % Final    Physical Exam:  General: alert, cooperative and appears stated age 9: appropriate Uterine Fundus: firm Incision: healing well, no significant drainage, no dehiscence DVT Evaluation: No evidence of DVT seen on physical exam.  Discharge Diagnoses: Term Pregnancy-delivered  Discharge Information: Date: 05/25/2018 Activity: pelvic rest Diet: routine Medications: None Condition: improved Instructions: refer to practice specific booklet Discharge to: home   Newborn Data: Live born female  Birth Weight: 9 lb 5.4 oz (4235 g) APGAR: 8, 9  Newborn Delivery   Birth date/time:  05/[birth year]/2018 23:57:00 Delivery type:  Vaginal, Spontaneous     Home with mother.  Yaphet Smethurst L 05/25/2018, 8:41 AM

## 2018-05-28 ENCOUNTER — Encounter (HOSPITAL_COMMUNITY)
Admission: RE | Admit: 2018-05-28 | Discharge: 2018-05-28 | Disposition: A | Discharge status: Home / Self Care | Payer: 59 | Source: Ambulatory Visit | Attending: Obstetrics and Gynecology | Admitting: Obstetrics and Gynecology

## 2018-05-29 ENCOUNTER — Inpatient Hospital Stay (HOSPITAL_COMMUNITY): Admission: AD | Admit: 2018-05-29 | Payer: 59 | Source: Ambulatory Visit | Admitting: Obstetrics and Gynecology

## 2018-05-29 SURGERY — Surgical Case
Anesthesia: Regional

## 2018-07-08 DIAGNOSIS — Z1389 Encounter for screening for other disorder: Secondary | ICD-10-CM | POA: Diagnosis not present

## 2018-07-21 MED FILL — NORETHINDRONE 0.35 MG TAB: 0.35 | 84 days supply | Qty: 84 | Fill #0

## 2018-07-21 MED FILL — valACYclovir HCL 1 GM TABS: 1 | 30 days supply | Qty: 30 | Fill #0

## 2018-08-14 DIAGNOSIS — Z3483 Encounter for supervision of other normal pregnancy, third trimester: Secondary | ICD-10-CM | POA: Diagnosis not present

## 2018-08-14 DIAGNOSIS — Z3482 Encounter for supervision of other normal pregnancy, second trimester: Secondary | ICD-10-CM | POA: Diagnosis not present

## 2018-09-18 ENCOUNTER — Telehealth: Payer: 59 | Admitting: Physician Assistant

## 2018-09-18 DIAGNOSIS — B9789 Other viral agents as the cause of diseases classified elsewhere: Secondary | ICD-10-CM

## 2018-09-18 DIAGNOSIS — J019 Acute sinusitis, unspecified: Secondary | ICD-10-CM | POA: Diagnosis not present

## 2018-09-18 NOTE — Progress Notes (Signed)
We are sorry that you are not feeling well.  Here is how we plan to help!  Based on what you have shared with me it looks like you have sinusitis.  Sinusitis is inflammation and infection in the sinus cavities of the head.  Based on your presentation I believe you most likely have Acute Viral Sinusitis.This is an infection most likely caused by a virus. There is not specific treatment for viral sinusitis other than to help you with the symptoms until the infection runs its course.  While breastfeeding you may take Tylenol, and products with dextromethorphan (Delsym, Robitussin DM). Can continue antihistamines (Claritin, Zyrtec or Xyzal). Saline nasal spray help and can safely be used as often as needed. You want to avoid nasal steroids like Flonase or Nasacort as this can be found in breastmilk  Some authorities believe that zinc sprays or the use of Echinacea may shorten the course of your symptoms.  Sinus infections are not as easily transmitted as other respiratory infection, however we still recommend that you avoid close contact with loved ones, especially the very young and elderly.  Remember to wash your hands thoroughly throughout the day as this is the number one way to prevent the spread of infection!  Home Care:  Only take medications as instructed by your medical team.  Do not take these medications with alcohol.  A steam or ultrasonic humidifier can help congestion.  You can place a towel over your head and breathe in the steam from hot water coming from a faucet.  Avoid close contacts especially the very young and the elderly.  Cover your mouth when you cough or sneeze.  Always remember to wash your hands.  Get Help Right Away If:  You develop worsening fever or sinus pain.  You develop a severe head ache or visual changes.  Your symptoms persist after you have completed your treatment plan.  Make sure you  Understand these instructions.  Will watch your  condition.  Will get help right away if you are not doing well or get worse.  Your e-visit answers were reviewed by a board certified advanced clinical practitioner to complete your personal care plan.  Depending on the condition, your plan could have included both over the counter or prescription medications.  If there is a problem please reply  once you have received a response from your provider.  Your safety is important to [place].  If you have drug allergies check your prescription carefully.    You can use MyChart to ask questions about today's visit, request a non-urgent call back, or ask for a work or school excuse for 24 hours related to this e-Visit. If it has been greater than 24 hours you will need to follow up with your provider, or enter a new e-Visit to address those concerns.  You will get an e-mail in the next two days asking about your experience.  I hope that your e-visit has been valuable and will speed your recovery. Thank you for using e-visits.

## 2018-09-18 NOTE — Progress Notes (Signed)
I have spent 5 minutes in review of e-visit questionnaire, review and updating patient chart, medical decision making and response to patient.   Elly Haffey Cody Dora Simeone, PA-C    

## 2018-09-29 DIAGNOSIS — Z3483 Encounter for supervision of other normal pregnancy, third trimester: Secondary | ICD-10-CM | POA: Diagnosis not present

## 2018-09-29 DIAGNOSIS — Z3482 Encounter for supervision of other normal pregnancy, second trimester: Secondary | ICD-10-CM | POA: Diagnosis not present

## 2018-10-02 MED FILL — NORETHINDRONE 0.35 MG TAB: 0.35 | 84 days supply | Qty: 84 | Fill #1

## 2018-11-17 ENCOUNTER — Encounter: Payer: 59 | Admitting: Family Medicine

## 2018-12-24 MED FILL — NORETHINDRONE 0.35 MG TAB: 0.35 | 84 days supply | Qty: 84 | Fill #2

## 2018-12-24 MED FILL — valACYclovir HCL 1 GM TABS: 1 | 30 days supply | Qty: 30 | Fill #1

## 2019-03-01 NOTE — Progress Notes (Signed)
Chief Complaint  Patient presents with  . Annual Exam    fasting annual exam. Will get flu shot at work. Left ear feels full to her, can you please check-used drops.     Tamara Spencer is a 28 y.o. female who presents for a complete physical.  She has the following concerns:  Anxiety started after having her second son (05/2018), got worse with COVID, really only recognized it about a month ago as anxiety.  Doing better since acknowledges it, talking with husband.  H/o excessive wax in her ears.  She tries not to use Q-tips; has been using Debrox drops.  Feels like she cannot clean the left one out. She would like it checked today. Denies significant decrease in her hearing. Sometimes seems like she hears water moving in it.  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  She hasn't had a period since January (had baby in November), so hasn't had any recent outbreaks. Still has medication at home.  She is currently on the mini-pill.  Immunization History  Administered Date(s) Administered  . HPV Quadrivalent 02/02/2011, 03/15/2011, 07/06/2011  . Influenza Split 04/08/2012, 04/12/2014  . Influenza Whole 04/15/2013  . Influenza-Unspecified 04/20/2016, 04/15/2017  . Td 07/09/2002  . Tdap 04/11/2009, 02/26/2018   Last Pap smear:  UTD through GYN Last mammogram: never Has had genetic testing for breast cancer. Last colonoscopy: never Last DEXA: never Dentist: twice yealry Ophtho: yearly Exercise: Walks with her kids.  Carrying kids a lot more, playing on breaks, more active at home.  Oldest son is 36# Lipid: Lab Results  Component Value Date   CHOL 148 05/20/2013   HDL 42 05/20/2013   LDLCALC 92 05/20/2013   TRIG 68 05/20/2013   CHOLHDL 3.5 05/20/2013   Thyroid screen: Lab Results  Component Value Date   TSH 0.985 03/11/2014   Vitamin D-OH level of 26 in 09/2016  Past Medical History:  Diagnosis Date  . Headache    monthly; less severe since on new  OCP  . Recurrent cold sores     Past Surgical History:  Procedure Laterality Date  . KELOID EXCISION    . WISDOM TOOTH EXTRACTION      Social History   Socioeconomic History  . Marital status: Married    Spouse name: Not on file  . Number of children: 0  . Years of education: Not on file  . Highest education level: Not on file  Occupational History  . Occupation: Wellsite geologist: [employer]  . Financial resource strain: Not hard at all  . Food insecurity    Worry: Never true    Inability: Never true  . Transportation needs    Medical: No    Non-medical: Not on file  Tobacco Use  . Smoking status: Never Smoker  . Smokeless tobacco: Never Used  Substance and Sexual Activity  . Alcohol use: No  . Drug use: No  . Sexual activity: Yes    Partners: Male    Birth control/protection: Pill    Comment: mini-pill  Lifestyle  . Physical activity    Days per week: Not on file    Minutes per session: Not on file  . Stress: Only a little  Relationships  . Social Herbalist on phone: Not on file    Gets together: Not on file    Attends religious service: Not on file    Active member of club or  organization: Not on file    Attends meetings of clubs or organizations: Not on file    Relationship status: Not on file  . Intimate partner violence    Fear of current or ex partner: No    Emotionally abused: No    Physically abused: No    Forced sexual activity: No  Other Topics Concern  . Not on file  Social History Narrative   Married, one son Enis Slipper).  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 Diabetes Father   . Hypertension Father   . Hyperlipidemia Father   . Heart disease Father 58  . Kidney disease Father   . Diabetes Paternal Grandfather   . Other Mother        Myleofibrosis  . Breast cancer Mother 33  . Breast cancer Other        dx in her 63s   . Leukemia Cousin 14       mother's maternal first cousin  . Breast cancer Cousin        mother's materanl first cousin dx in her 34s    Outpatient Encounter Medications as of 03/02/2019  Medication Sig  . norethindrone (JOLIVETTE) 0.35 MG tablet Take 1 tablet by mouth daily.  . Prenatal Vit-Fe Fumarate-FA (PRENATAL MULTIVITAMIN) TABS tablet Take 1 tablet by mouth daily at 12 noon.  . valACYclovir (VALTREX) 1000 MG tablet Take 1,000 mg by mouth 2 (two) times daily.   No facility-administered encounter medications on file as of 03/02/2019.     Allergies  Allergen Reactions  . Adhesive [Tape] Rash    ROS:  The patient denies anorexia, fever, headaches, vision changes, decreased hearing, ear pain, sore throat, breast concerns, chest pain, palpitations, dizziness, syncope, dyspnea on exertion, cough, swelling, nausea, vomiting, diarrhea, constipation, abdominal pain, melena, hematochezia, indigestion/heartburn, hematuria, incontinence, dysuria, irregular menstrual cycles (currently absent, while on mini-pill and breastfeeding), vaginal discharge, odor or itch, genital lesions, joint pains, numbness, tingling, weakness, tremor, suspicious skin lesions, depression, anxiety, abnormal bleeding/bruising, or enlarged lymph nodes. +anxiety, feels tired a lot. Previously got down to 170# which is her goal (down to 190# after baby, pre-COVID, then re-gained weight)    PHYSICAL EXAM:  BP 124/84   Pulse 72   Ht 5\' 8"  (1.727 m)   Wt 205 lb 12.8 oz (93.4 kg)   Breastfeeding Yes   BMI 31.29 kg/m   General Appearance:    Alert, cooperative, no distress, appears stated age  Head:    Normocephalic, without obvious abnormality, atraumatic  Eyes:    PERRL, conjunctiva/corneas clear, EOM's intact, fundi    Benign. Right iris--brown color superiorly, as well as a brown spot at 4 o'clock position  Ears:    Normal TM's and external ear canals  Nose:   Not examined (wearing mask due to COVID-19  pandemic)  Throat:   Not examined (wearing mask due to COVID-19 pandemic)  Neck:   Supple, no lymphadenopathy;  thyroid:  no enlargement/ tenderness/nodules; no carotidbruit or JVD  Back:    Spine nontender, no curvature, ROM normal, no CVA  tenderness  Lungs:     Clear to auscultation bilaterally without wheezes, rales or  ronchi; respirations unlabored  Chest Wall:    No tenderness or deformity   Heart:    Regular rate and rhythm, S1 and S2 normal, no rub or gallop. Faint murmur noted at RUSB.  Breast Exam:  Deferred to GYN  Abdomen:     Soft, non-tender, nondistended, normoactive bowel sounds,    no masses, no hepatosplenomegaly  Genitalia:    Deferred to GYN     Extremities:   No clubbing, cyanosis or edema  Pulses:   2+ and symmetric all extremities  Skin:   Skin color, texture, turgor normal. Hypertrophic scar left shoulder/upper arm.   5mm brown macule on the bottom of the left great toe.  47mm darker brown macule at the base of the right 2nd toe (lateral aspect). These are unchanged from last year.  Lymph nodes:   Cervical, supraclavicular, and axillary nodes normal  Neurologic:   CNII-XII intact, normal strength, sensation and gait; reflexes 2+ and symmetric throughout                                Psych:   Normal mood, affect, hygiene and grooming.               GAD-7 score of 12   ASSESSMENT/PLAN:   Annual physical exam - Plan: VITAMIN D 25 Hydroxy (Vit-D Deficiency, Fractures), CBC with Differential/Platelet, Comprehensive metabolic panel, Lipid panel, TSH  Vitamin D deficiency - currently only taking PNV; may need add'l supplement, check level - Plan: VITAMIN D 25 Hydroxy (Vit-D Deficiency, Fractures)  Other fatigue - check labs; discussed adequate sleep, exercise - Plan: VITAMIN D 25 Hydroxy (Vit-D Deficiency, Fractures), CBC with Differential/Platelet, Comprehensive metabolic panel, TSH  Need for influenza vaccination - Plan: Flu Vaccine QUAD 6+ mos PF IM (Fluarix  Quad PF)  Generalized anxiety disorder - encouraged counseling (EAP), stress reduction techniques discussed. She is hesitant to start meds due to breastfeeding; will f/u if worse  Anxiety--hesitant to start medications, breastfeeding   Discussed monthly self breast exams and yearly mammograms--to discuss age recommendation with her GYN (and ?need for MRI?); at least 30 minutes of aerobic activity at least 5 days/week, weight-bearing exercise at least 2x/week; proper sunscreen use reviewed; healthy diet, including goals of calcium and vitamin D intake and alcohol recommendations (less than or equal to 1 drink/day) reviewed; regular seatbelt use; changing batteries in smoke detectors.  Immunization recommendations discussed, UTD. Continue yearly flu shots.  Colonoscopy recommendations reviewed (50, or 45 if/when guidelines change and if covered at the earlier age).  F/u 1 year, sooner prn

## 2019-03-01 NOTE — Patient Instructions (Addendum)
HEALTH MAINTENANCE RECOMMENDATIONS:  It is recommended that you get at least 30 minutes of aerobic exercise at least 5 days/week (for weight loss, you may need as much as 60-90 minutes). This can be any activity that gets your heart rate up. This can be divided in 10-15 minute intervals if needed, but try and build up your endurance at least once a week.  Weight bearing exercise is also recommended twice weekly.  Eat a healthy diet with lots of vegetables, fruits and fiber.  "Colorful" foods have a lot of vitamins (ie green vegetables, tomatoes, red peppers, etc).  Limit sweet tea, regular sodas and alcoholic beverages, all of which has a lot of calories and sugar.  Up to 1 alcoholic drink daily may be beneficial for women (unless trying to lose weight, watch sugars).  Drink a lot of water.  Calcium recommendations are 1200-1500 mg daily (1500 mg for postmenopausal women or women without ovaries), and vitamin D 1000 IU daily.  This should be obtained from diet and/or supplements (vitamins), and calcium should not be taken all at once, but in divided doses.  Monthly self breast exams and yearly mammograms for women over the age of 43 is recommended.  Sunscreen of at least SPF 30 should be used on all sun-exposed parts of the skin when outside between the hours of 10 am and 4 pm (not just when at beach or pool, but even with exercise, golf, tennis, and yard work!)  Use a sunscreen that says "broad spectrum" so it covers both UVA and UVB rays, and make sure to reapply every 1-2 hours.  Remember to change the batteries in your smoke detectors when changing your clock times in the spring and fall. Carbon monoxide detectors are recommended for your home.  Use your seat belt every time you are in a car, and please drive safely and not be distracted with cell phones and texting while driving.  I recommend contact Employee Assistance Program through [place] for counseling.   Mindfulness-Based Stress  Reduction Mindfulness-based stress reduction (MBSR) is a program that helps people learn to practice mindfulness. Mindfulness is the practice of intentionally paying attention to the present moment. It can be learned and practiced through techniques such as education, breathing exercises, meditation, and yoga. MBSR includes several mindfulness techniques in one program. MBSR works best when you understand the treatment, are willing to try new things, and can commit to spending time practicing what you learn. MBSR training may include learning about:  How your emotions, thoughts, and reactions affect your body.  New ways to respond to things that cause negative thoughts to start (triggers).  How to notice your thoughts and let go of them.  Practicing awareness of everyday things that you normally do without thinking.  The techniques and goals of different types of meditation. What are the benefits of MBSR? MBSR can have many benefits, which include helping you to:  Develop self-awareness. This refers to knowing and understanding yourself.  Learn skills and attitudes that help you to participate in your own health care.  Learn new ways to care for yourself.  Be more accepting about how things are, and let things go.  Be less judgmental and approach things with an open mind.  Be patient with yourself and trust yourself more. MBSR has also been shown to:  Reduce negative emotions, such as depression and anxiety.  Improve memory and focus.  Change how you sense and approach pain.  Boost your body's ability to fight  infections.  Help you connect better with other people.  Improve your sense of well-being. Follow these instructions at home:   Find a local in-person or online MBSR program.  Set aside some time regularly for mindfulness practice.  Find a mindfulness practice that works best for you. This may include one or more of the following: ? Meditation. Meditation  involves focusing your mind on a certain thought or activity. ? Breathing awareness exercises. These help you to stay present by focusing on your breath. ? Body scan. For this practice, you lie down and pay attention to each part of your body from head to toe. You can identify tension and soreness and intentionally relax parts of your body. ? Yoga. Yoga involves stretching and breathing, and it can improve your ability to move and be flexible. It can also provide an experience of testing your body's limits, which can help you release stress. ? Mindful eating. This way of eating involves focusing on the taste, texture, color, and smell of each bite of food. Because this slows down eating and helps you feel full sooner, it can be an important part of a weight-loss plan.  Find a podcast or recording that provides guidance for breathing awareness, body scan, or meditation exercises. You can listen to these any time when you have a free moment to rest without distractions.  Follow your treatment plan as told by your health care provider. This may include taking regular medicines and making changes to your diet or lifestyle as recommended. How to practice mindfulness To do a basic awareness exercise:  Find a comfortable place to sit.  Pay attention to the present moment. Observe your thoughts, feelings, and surroundings just as they are.  Avoid placing judgment on yourself, your feelings, or your surroundings. Make note of any judgment that comes up, and let it go.  Your mind may wander, and that is okay. Make note of when your thoughts drift, and return your attention to the present moment. To do basic mindfulness meditation:  Find a comfortable place to sit. This may include a stable chair or a firm floor cushion. ? Sit upright with your back straight. Let your arms fall next to your side with your hands resting on your legs. ? If sitting in a chair, rest your feet flat on the floor. ? If sitting  on a cushion, cross your legs in front of you.  Keep your head in a neutral position with your chin dropped slightly. Relax your jaw and rest the tip of your tongue on the roof of your mouth. Drop your gaze to the floor. You can close your eyes if you like.  Breathe normally and pay attention to your breath. Feel the air moving in and out of your nose. Feel your belly expanding and relaxing with each breath.  Your mind may wander, and that is okay. Make note of when your thoughts drift, and return your attention to your breath.  Avoid placing judgment on yourself, your feelings, or your surroundings. Make note of any judgment or feelings that come up, let them go, and bring your attention back to your breath.  When you are ready, lift your gaze or open your eyes. Pay attention to how your body feels after the meditation. Where to find more information You can find more information about MBSR from:  Your health care provider.  Community-based meditation centers or programs.  Programs offered near you. Summary  Mindfulness-based stress reduction (MBSR) is a program  that teaches you how to intentionally pay attention to the present moment. It is used with other treatments to help you cope better with daily stress, emotions, and pain.  MBSR focuses on developing self-awareness, which allows you to respond to life stress without judgment or negative emotions.  MBSR programs may involve learning different mindfulness practices, such as breathing exercises, meditation, yoga, body scan, or mindful eating. Find a mindfulness practice that works best for you, and set aside time for it on a regular basis. This information is not intended to replace advice given to you by your health care provider. Make sure you discuss any questions you have with your health care provider. Document Released: 11/01/2016 Document Revised: 06/07/2017 Document Reviewed: 11/01/2016 Elsevier Patient Education  2020  [employer].

## 2019-03-02 ENCOUNTER — Encounter: Payer: Self-pay | Admitting: Family Medicine

## 2019-03-02 ENCOUNTER — Other Ambulatory Visit: Payer: Self-pay

## 2019-03-02 ENCOUNTER — Ambulatory Visit (INDEPENDENT_AMBULATORY_CARE_PROVIDER_SITE_OTHER): Payer: 59 | Admitting: Family Medicine

## 2019-03-02 VITALS — BP 124/84 | HR 72 | Ht 68.0 in | Wt 205.8 lb

## 2019-03-02 DIAGNOSIS — E559 Vitamin D deficiency, unspecified: Secondary | ICD-10-CM

## 2019-03-02 DIAGNOSIS — F411 Generalized anxiety disorder: Secondary | ICD-10-CM

## 2019-03-02 DIAGNOSIS — R5383 Other fatigue: Secondary | ICD-10-CM | POA: Diagnosis not present

## 2019-03-02 DIAGNOSIS — Z0000 Encounter for general adult medical examination without abnormal findings: Secondary | ICD-10-CM

## 2019-03-02 DIAGNOSIS — Z23 Encounter for immunization: Secondary | ICD-10-CM | POA: Diagnosis not present

## 2019-03-03 LAB — COMPREHENSIVE METABOLIC PANEL
ALT: 13 IU/L (ref 0–32)
AST: 13 IU/L (ref 0–40)
Albumin/Globulin Ratio: 1.7 (ref 1.2–2.2)
Albumin: 4.8 g/dL (ref 3.9–5.0)
Alkaline Phosphatase: 74 IU/L (ref 39–117)
BUN/Creatinine Ratio: 17 (ref 9–23)
BUN: 9 mg/dL (ref 6–20)
Bilirubin Total: 0.7 mg/dL (ref 0.0–1.2)
CO2: 22 mmol/L (ref 20–29)
Calcium: 9.3 mg/dL (ref 8.7–10.2)
Chloride: 103 mmol/L (ref 96–106)
Creatinine, Ser: 0.54 mg/dL — ABNORMAL LOW (ref 0.57–1.00)
GFR calc Af Amer: 146 mL/min/{1.73_m2} (ref >59)
GFR calc non Af Amer: 127 mL/min/{1.73_m2} (ref >59)
Globulin, Total: 2.8 g/dL (ref 1.5–4.5)
Glucose: 89 mg/dL (ref 65–99)
Potassium: 4.4 mmol/L (ref 3.5–5.2)
Sodium: 140 mmol/L (ref 134–144)
Total Protein: 7.6 g/dL (ref 6.0–8.5)

## 2019-03-03 LAB — LIPID PANEL
Chol/HDL Ratio: 3.3 ratio (ref 0.0–4.4)
Cholesterol, Total: 140 mg/dL (ref 100–199)
HDL: 43 mg/dL (ref >39)
LDL Calculated: 87 mg/dL (ref 0–99)
Triglycerides: 52 mg/dL (ref 0–149)
VLDL Cholesterol Cal: 10 mg/dL (ref 5–40)

## 2019-03-03 LAB — CBC WITH DIFFERENTIAL/PLATELET
Basophils Absolute: 0.1 10*3/uL (ref 0.0–0.2)
Basos: 1 %
EOS (ABSOLUTE): 0.1 10*3/uL (ref 0.0–0.4)
Eos: 1 %
Hematocrit: 42 % (ref 34.0–46.6)
Hemoglobin: 14 g/dL (ref 11.1–15.9)
Immature Grans (Abs): 0 10*3/uL (ref 0.0–0.1)
Immature Granulocytes: 0 %
Lymphocytes Absolute: 2.7 10*3/uL (ref 0.7–3.1)
Lymphs: 30 %
MCH: 27.6 pg (ref 26.6–33.0)
MCHC: 33.3 g/dL (ref 31.5–35.7)
MCV: 83 fL (ref 79–97)
Monocytes Absolute: 0.5 10*3/uL (ref 0.1–0.9)
Monocytes: 6 %
Neutrophils Absolute: 5.6 10*3/uL (ref 1.4–7.0)
Neutrophils: 62 %
Platelets: 307 10*3/uL (ref 150–450)
RBC: 5.07 x10E6/uL (ref 3.77–5.28)
RDW: 13.2 % (ref 11.7–15.4)
WBC: 9 10*3/uL (ref 3.4–10.8)

## 2019-03-03 LAB — VITAMIN D 25 HYDROXY (VIT D DEFICIENCY, FRACTURES): Vit D, 25-Hydroxy: 30.1 ng/mL (ref 30.0–100.0)

## 2019-03-03 LAB — TSH: TSH: 0.73 u[IU]/mL (ref 0.450–4.500)

## 2019-03-16 MED FILL — NORETHINDRONE 0.35 MG TABS: 0.35 | 84 days supply | Qty: 84 | Fill #3

## 2019-03-30 ENCOUNTER — Encounter: Payer: Self-pay | Admitting: Gynecology

## 2019-04-15 DIAGNOSIS — Z20828 Contact with and (suspected) exposure to other viral communicable diseases: Secondary | ICD-10-CM | POA: Diagnosis not present

## 2019-05-05 DIAGNOSIS — H52223 Regular astigmatism, bilateral: Secondary | ICD-10-CM | POA: Diagnosis not present

## 2019-05-05 DIAGNOSIS — H5213 Myopia, bilateral: Secondary | ICD-10-CM | POA: Diagnosis not present

## 2019-07-25 ENCOUNTER — Telehealth: Payer: 59 | Admitting: Physician Assistant

## 2019-07-25 DIAGNOSIS — N61 Mastitis without abscess: Secondary | ICD-10-CM | POA: Diagnosis not present

## 2019-07-25 MED ORDER — CEPHALEXIN 500 MG PO CAPS: 500.0000 mg | ORAL_CAPSULE | Freq: Four times a day (QID) | ORAL | 0 refills | Status: DC

## 2019-07-25 NOTE — Progress Notes (Signed)
E Visit for Mastitis  We are sorry that you are not feeling well. Here is how we plan to help!  Thank you for the details you included in the comment boxes. Those details are very helpful in determining the best course of treatment for you and help [place] to provide the best care.  Mastitis is the most likely diagnosis for this.  And you are a very good historian for letting me know everything that you have had in the past.  A prescription for cephalexin 500 mg 4 times a day for 10 days will be sent to your pharmacy.  Supportive management of nonsevere lactational mastitis consists of symptomatic treatment to reduce pain and swelling (nonsteroidal inflammatory agents, cold compresses) and complete emptying of the breast (via ongoing breastfeeding, pumping, and/or hand expression); cessation of lactation is not required.  If anything should get worse, your fever increase etc. please contact your PCP, or go to an urgent care or emergency room for help.    GET HELP RIGHT AWAY IF:   Symptoms don't go away after treatment.  Severe itching that persists.  If you rash spreads or swells.  If you rash begins to smell.  If it blisters and opens or develops a yellow-brown crust.  You develop a fever.   MAKE SURE YOU:  Understand these instructions. Will watch your condition. Will get help right away if you are not doing well or get worse.  Thank you for choosing an e-visit. Your e-visit answers were reviewed by a board certified advanced clinical practitioner to complete your personal care plan. Depending upon the condition, your plan could have included both over the counter or prescription medications. Please review your pharmacy choice. Be sure that the pharmacy you have chosen is open so that you can pick up your prescription now.  If there is a problem you may message your provider in [place] to have the prescription routed to another pharmacy. Your safety is important to [place]. If you  have drug allergies check your prescription carefully.  For the next 24 hours, you can use MyChart to ask questions about today's visit, request a non-urgent call back, or ask for a work or school excuse from your e-visit provider. You will get an email in the next two days asking about your experience. I hope that your e-visit has been valuable and will speed your recovery.   Particia Nearing PA-C  Approximately 5 minutes was spent documenting and reviewing patient's chart.

## 2019-08-10 DIAGNOSIS — F419 Anxiety disorder, unspecified: Secondary | ICD-10-CM | POA: Diagnosis not present

## 2019-08-10 DIAGNOSIS — Z6833 Body mass index (BMI) 33.0-33.9, adult: Secondary | ICD-10-CM | POA: Diagnosis not present

## 2019-08-10 DIAGNOSIS — Z309 Encounter for contraceptive management, unspecified: Secondary | ICD-10-CM | POA: Diagnosis not present

## 2019-08-10 DIAGNOSIS — Z01419 Encounter for gynecological examination (general) (routine) without abnormal findings: Secondary | ICD-10-CM | POA: Diagnosis not present

## 2019-09-09 MED FILL — LO LOESTRIN FE 1-10 TABLET: 1 MG-10 MCG | 84 days supply | Qty: 84 | Fill #0

## 2019-09-09 MED FILL — SERTRALINE HCL 25 MG TABLET: 25 | 30 days supply | Qty: 30 | Fill #0

## 2019-09-18 ENCOUNTER — Telehealth: Payer: 59 | Admitting: Nurse Practitioner

## 2019-09-18 DIAGNOSIS — B354 Tinea corporis: Secondary | ICD-10-CM | POA: Diagnosis not present

## 2019-09-18 NOTE — Progress Notes (Signed)
E Visit for Rash  We are sorry that you are not feeling well. Here is how we plan to help!   Based upon your presentation it appears you have a ring worm infection.  The lotrimin cream should work fine but can take several weeks to completely resolve. Try will hard to not scratch at it.   HOME CARE:   Take cool showers and avoid direct sunlight.  Apply cool compress or wet dressings.  Take a bath in an oatmeal bath.  Sprinkle content of one Aveeno packet under running faucet with comfortably warm water.  Bathe for 15-20 minutes, 1-2 times daily.  Pat dry with a towel. Do not rub the rash.  Use hydrocortisone cream.  Take an antihistamine like Benadryl for widespread rashes that itch.  The adult dose of Benadryl is 25-50 mg by mouth 4 times daily.  Caution:  This type of medication may cause sleepiness.  Do not drink alcohol, drive, or operate dangerous machinery while taking antihistamines.  Do not take these medications if you have prostate enlargement.  Read package instructions thoroughly on all medications that you take.  GET HELP RIGHT AWAY IF:   Symptoms don't go away after treatment.  Severe itching that persists.  If you rash spreads or swells.  If you rash begins to smell.  If it blisters and opens or develops a yellow-brown crust.  You develop a fever.  You have a sore throat.  You become short of breath.  MAKE SURE YOU:  Understand these instructions. Will watch your condition. Will get help right away if you are not doing well or get worse.  Thank you for choosing an e-visit. Your e-visit answers were reviewed by a board certified advanced clinical practitioner to complete your personal care plan. Depending upon the condition, your plan could have included both over the counter or prescription medications. Please review your pharmacy choice. Be sure that the pharmacy you have chosen is open so that you can pick up your prescription now.  If there is a  problem you may message your provider in [place] to have the prescription routed to another pharmacy. Your safety is important to [place]. If you have drug allergies check your prescription carefully.  For the next 24 hours, you can use MyChart to ask questions about today's visit, request a non-urgent call back, or ask for a work or school excuse from your e-visit provider. You will get an email in the next two days asking about your experience. I hope that your e-visit has been valuable and will speed your recovery.   5-10 minutes spent reviewing and documenting in chart.

## 2019-10-06 ENCOUNTER — Telehealth: Payer: Self-pay

## 2019-10-06 ENCOUNTER — Encounter: Payer: Self-pay | Admitting: Family Medicine

## 2019-10-06 MED FILL — SERTRALINE HCL 25 MG TABLET: 25 | 30 days supply | Qty: 30 | Fill #1

## 2019-10-06 NOTE — Patient Instructions (Signed)

## 2019-10-06 NOTE — Telephone Encounter (Signed)
I called pt. LM for her to send you a picture of the rash on My Chart before her apt. Tomorrow.

## 2019-10-06 NOTE — Telephone Encounter (Signed)
-----   Message from Rita Ohara, MD sent at 10/05/2019  5:00 PM EDT ----- She has visit on Wed for a rash. It is virtual.  For virtual rashes, I need them to send photos through MyChart in advance of their visit please.    Please have her try and do this.  Thanks

## 2019-10-06 NOTE — Progress Notes (Signed)
Start time: 11:35 End time: 11:53   Virtual Visit via Video Note  I connected with Tamara Spencer on 10/07/2019 by a video enabled telemedicine application and verified that I am speaking with the correct person using two identifiers.  Location: Patient: home Provider: office   I discussed the limitations of evaluation and management by telemedicine and the availability of in person appointments. The patient expressed understanding and agreed to proceed.  History of Present Illness:  Chief Complaint  Patient presents with  . Rash    VIRTUAL rash on thighs, more on her left but spreading to the right (sent pics). Started early February. Started using OTC creams and did E-visit to make sure she was using correct creams, which she was. Getting better but still spreading, new spots. Tried Lotrimin AF, Lotrimin AF spray, Lamisil cream and vagisil cream. Using the Lotrimin AF spray currently.     She had an e-visit on 3/12 for her rash, which was diagnosed as ringworm. At that time she had already tried using lotrimin 3-4x/day for 2.5 weeks. She had 1 large spot on left leg (size of a quarter) and 5 smaller spots (1 on the right, the rest on the left).  The cream partially improved the larger one (part of the circle resolved).  It was itchy, and started getting slightly painful at the time of her e-visit. (photo from e-visit reviewed today).  The 1 large lesion on the L leg has coalesced with 2 smaller areas, making one large area on the L inner/upper thigh (see photos sent yesterday) +kissing lesions on right inner thigh.  She tried using Lotrimin, Lamisil, and Vagisil.  She felt like the spray version of lotrimin for athlete's foot has been the most effective (which is miconazole 2% spray).  While there is some improvement, it seems to still be spreading some. It remains itchy. She has been using this spray since last week.  She states the redness of the large spot has improved a lot. She is  concerned because she keeps finding new spots.  She is breastfeeding. Pumping twice daily.  She is weaning back, expects to stop nursing by the end of April  [facility], [facility], [facility] reviewed  Outpatient Encounter Medications as of 10/07/2019  Medication Sig Note  . Miconazole Nitrate 2 % AERO Apply topically in the morning and at bedtime. 10/07/2019: This is the Lotrimin spray she has been using  . Norethindrone-Ethinyl Estradiol-Fe Biphas (LO LOESTRIN FE) 1 MG-10 MCG / 10 MCG tablet Take 1 tablet by mouth daily.   . sertraline (ZOLOFT) 25 MG tablet Take 25 mg by mouth daily.   . valACYclovir (VALTREX) 1000 MG tablet Take 1,000 mg by mouth 2 (two) times daily.   . [DISCONTINUED] cephALEXin (KEFLEX) 500 MG capsule Take 1 capsule (500 mg total) by mouth 4 (four) times daily.   . [DISCONTINUED] norethindrone (JOLIVETTE) 0.35 MG tablet Take 1 tablet by mouth daily.   . [DISCONTINUED] Prenatal Vit-Fe Fumarate-FA (PRENATAL MULTIVITAMIN) TABS tablet Take 1 tablet by mouth daily at 12 noon.    No facility-administered encounter medications on file as of 10/07/2019.   Allergies  Allergen Reactions  . Adhesive [Tape] Rash   ROS: no fever, chills, other rashes, bleeding or bruising. She otherwise feels good--no fever, URI symptoms or other complaints.    Observations/Objective:  Temp 98.2 F (36.8 C) (Temporal)   Ht 5\' 8"  (1.727 m)   Wt 212 lb (96.2 kg)   Breastfeeding Yes   BMI 32.23 kg/m  Well-appearing, pleasant female, in good spirits, in no distress She is alert and oriented.  Images reviewed (the ones sent 3/29, as well as the original one sent 3/12).  The edges appear to still be active--raised, and serpiginous border, mainly along one side, not the whole perimeter. The central area is hyperpigmented, not clear.  Assessment and Plan:  Tinea corporis - discussed keeping areas dry, preventing contact (kissing lesions), proper use of meds and length of treatment. Oral meds if needed when  no longer nursing   There is no data on oral antifungal use while breastfeeding, so cannot recommend. Discussed the various topical (OTC) treatments. She seems to be doing well on the  Miconazole 2% spray BID. Discussed length of treatment (1-2 weeks beyond when looks clear), and to not use too sparingly.  Discussed that skin scrapings (when off antifungal topical treatments) may be needed if not improving, vs refer to derm.  Doesn't sound like granuloma annulare in how it is spreading (kissing lesions, lesions coalescing). May need oral antifungal if not completely resolving, but wait until no longer breastfeeding (end of April); will continue topical treatment in the meantime, since there has been some improvement.    Follow Up Instructions:    I discussed the assessment and treatment plan with the patient. The patient was provided an opportunity to ask questions and all were answered. The patient agreed with the plan and demonstrated an understanding of the instructions.   The patient was advised to call back or seek an in-person evaluation if the symptoms worsen or if the condition fails to improve as anticipated.  I provided 18 minutes of video face-to-face time during this encounter. Additional time spent in chart review and documentation.   Vikki Ports, MD

## 2019-10-07 ENCOUNTER — Other Ambulatory Visit: Payer: Self-pay

## 2019-10-07 ENCOUNTER — Ambulatory Visit: Payer: 59 | Admitting: Family Medicine

## 2019-10-07 ENCOUNTER — Encounter: Payer: Self-pay | Admitting: Family Medicine

## 2019-10-07 VITALS — Temp 98.2°F | Ht 68.0 in | Wt 212.0 lb

## 2019-10-07 DIAGNOSIS — B354 Tinea corporis: Secondary | ICD-10-CM | POA: Diagnosis not present

## 2019-10-28 ENCOUNTER — Other Ambulatory Visit (HOSPITAL_COMMUNITY): Payer: Self-pay | Admitting: Obstetrics and Gynecology

## 2019-11-03 ENCOUNTER — Encounter: Payer: Self-pay | Admitting: Family Medicine

## 2019-11-03 MED ORDER — TERBINAFINE HCL 250 MG PO TABS: 250.0000 mg | ORAL_TABLET | Freq: Every day | ORAL | 0 refills | Status: DC

## 2019-11-04 MED FILL — TERBINAFINE HCL 250 MG TAB: 250 | 14 days supply | Qty: 14 | Fill #0

## 2019-11-09 MED FILL — SERTRALINE HCL 25 MG TABLET: 25 | 30 days supply | Qty: 30 | Fill #0

## 2019-11-26 ENCOUNTER — Encounter: Payer: Self-pay | Admitting: Family Medicine

## 2019-11-26 MED ORDER — TERBINAFINE HCL 250 MG PO TABS: 250.0000 mg | ORAL_TABLET | Freq: Every day | ORAL | 0 refills | Status: DC

## 2019-11-26 MED FILL — TERBINAFINE HCL 250 MG TAB: 250 | 14 days supply | Qty: 14 | Fill #0

## 2019-12-08 MED FILL — SERTRALINE HCL 25 MG TABLET: 25 | 30 days supply | Qty: 30 | Fill #1

## 2020-01-07 MED FILL — LO LOESTRIN FE 1-10 TABLET: 1 MG-10 MCG | 84 days supply | Qty: 84 | Fill #1

## 2020-01-07 MED FILL — SERTRALINE HCL 25 MG TABLET: 25 | 30 days supply | Qty: 30 | Fill #2

## 2020-02-03 MED FILL — SERTRALINE HCL 25 MG TABLET: 25 | 30 days supply | Qty: 30 | Fill #3

## 2020-02-05 DIAGNOSIS — L821 Other seborrheic keratosis: Secondary | ICD-10-CM | POA: Diagnosis not present

## 2020-02-05 DIAGNOSIS — D225 Melanocytic nevi of trunk: Secondary | ICD-10-CM | POA: Diagnosis not present

## 2020-02-05 DIAGNOSIS — D2262 Melanocytic nevi of left upper limb, including shoulder: Secondary | ICD-10-CM | POA: Diagnosis not present

## 2020-02-05 DIAGNOSIS — D2239 Melanocytic nevi of other parts of face: Secondary | ICD-10-CM | POA: Diagnosis not present

## 2020-02-05 DIAGNOSIS — L910 Hypertrophic scar: Secondary | ICD-10-CM | POA: Diagnosis not present

## 2020-02-05 DIAGNOSIS — L720 Epidermal cyst: Secondary | ICD-10-CM | POA: Diagnosis not present

## 2020-03-02 ENCOUNTER — Encounter: Payer: 59 | Admitting: Family Medicine

## 2020-03-03 MED FILL — SERTRALINE HCL 25 MG TABLET: 25 | 30 days supply | Qty: 30 | Fill #4

## 2020-03-06 ENCOUNTER — Telehealth: Payer: 59 | Admitting: Physician Assistant

## 2020-03-06 ENCOUNTER — Encounter: Payer: Self-pay | Admitting: Physician Assistant

## 2020-03-06 DIAGNOSIS — J028 Acute pharyngitis due to other specified organisms: Secondary | ICD-10-CM

## 2020-03-06 DIAGNOSIS — J208 Acute bronchitis due to other specified organisms: Secondary | ICD-10-CM | POA: Diagnosis not present

## 2020-03-06 MED ORDER — BENZONATATE 100 MG PO CAPS: 100.0000 mg | ORAL_CAPSULE | Freq: Three times a day (TID) | ORAL | 0 refills | Status: DC | PRN

## 2020-03-06 MED ORDER — AMOXICILLIN 500 MG PO CAPS: 500.0000 mg | ORAL_CAPSULE | Freq: Two times a day (BID) | ORAL | 0 refills | Status: DC

## 2020-03-06 MED ORDER — PREDNISONE 5 MG PO TABS: ORAL_TABLET | ORAL | 0 refills | Status: DC

## 2020-03-06 NOTE — Progress Notes (Signed)
We are sorry that you are not feeling well.  Here is how we plan to help!  Based on your presentation I believe you most likely have A cough due to bacteria.  When patients have a fever and a productive cough with a change in color or increased sputum production, we are concerned about bacterial bronchitis.  If left untreated it can progress to pneumonia.  If your symptoms do not improve with your treatment plan it is important that you contact your provider.   I have prescribed antbiotic Amoxicillin 500 mg one pill twice daily for 10 days, this is due to the severity and chronicity of the sore throat and cough.   In addition you may use A prescription cough medication called Tessalon Perles 100mg . You may take 1-2 capsules every 8 hours as needed for your cough.  Prednisone 5 mg daily for 6 days (see taper instructions below)  Directions for 6 day taper: Day 1: 2 tablets before breakfast, 1 after both lunch & dinner and 2 at bedtime Day 2: 1 tab before breakfast, 1 after both lunch & dinner and 2 at bedtime Day 3: 1 tab at each meal & 1 at bedtime Day 4: 1 tab at breakfast, 1 at lunch, 1 at bedtime Day 5: 1 tab at breakfast & 1 tab at bedtime Day 6: 1 tab at breakfast   From your responses in the eVisit questionnaire you describe inflammation in the upper respiratory tract which is causing a significant cough.  This is commonly called Bronchitis and has four common causes:    Allergies  Viral Infections  Acid Reflux  Bacterial Infection Allergies, viruses and acid reflux are treated by controlling symptoms or eliminating the cause. An example might be a cough caused by taking certain blood pressure medications. You stop the cough by changing the medication. Another example might be a cough caused by acid reflux. Controlling the reflux helps control the cough.  USE OF BRONCHODILATOR ("RESCUE") INHALERS: There is a risk from using your bronchodilator too frequently.  The risk is that  over-reliance on a medication which only relaxes the muscles surrounding the breathing tubes can reduce the effectiveness of medications prescribed to reduce swelling and congestion of the tubes themselves.  Although you feel brief relief from the bronchodilator inhaler, your asthma may actually be worsening with the tubes becoming more swollen and filled with mucus.  This can delay other crucial treatments, such as oral steroid medications. If you need to use a bronchodilator inhaler daily, several times per day, you should discuss this with your provider.  There are probably better treatments that could be used to keep your asthma under control.     HOME CARE . Only take medications as instructed by your medical team. . Complete the entire course of an antibiotic. . Drink plenty of fluids and get plenty of rest. . Avoid close contacts especially the very young and the elderly . Cover your mouth if you cough or cough into your sleeve. . Always remember to wash your hands . A steam or ultrasonic humidifier can help congestion.   GET HELP RIGHT AWAY IF: . You develop worsening fever. . You become short of breath . You cough up blood. . Your symptoms persist after you have completed your treatment plan MAKE SURE YOU   Understand these instructions.  Will watch your condition.  Will get help right away if you are not doing well or get worse.  Your e-visit answers were reviewed by  a board certified advanced clinical practitioner to complete your personal care plan.  Depending on the condition, your plan could have included both over the counter or prescription medications. If there is a problem please reply  once you have received a response from your provider. Your safety is important to [place].  If you have drug allergies check your prescription carefully.    You can use MyChart to ask questions about today's visit, request a non-urgent call back, or ask for a work or school excuse for 24 hours  related to this e-Visit. If it has been greater than 24 hours you will need to follow up with your provider, or enter a new e-Visit to address those concerns. You will get an e-mail in the next two days asking about your experience.  I hope that your e-visit has been valuable and will speed your recovery. Thank you for using e-visits.  I spent 5-10 minutes on review and completion of this note- Lacy Duverney [facility]

## 2020-03-12 ENCOUNTER — Emergency Department
Admission: RE | Admit: 2020-03-12 | Discharge: 2020-03-12 | Disposition: A | Discharge status: Home / Self Care | Payer: 59 | Source: Ambulatory Visit

## 2020-03-12 ENCOUNTER — Encounter (HOSPITAL_COMMUNITY): Payer: Self-pay

## 2020-03-12 ENCOUNTER — Emergency Department (HOSPITAL_COMMUNITY): Payer: 59

## 2020-03-12 ENCOUNTER — Emergency Department (HOSPITAL_COMMUNITY)
Admission: EM | Admit: 2020-03-12 | Discharge: 2020-03-12 | Disposition: A | Discharge status: Home / Self Care | Payer: 59 | Attending: Emergency Medicine | Admitting: Emergency Medicine

## 2020-03-12 ENCOUNTER — Other Ambulatory Visit: Payer: Self-pay

## 2020-03-12 ENCOUNTER — Emergency Department (INDEPENDENT_AMBULATORY_CARE_PROVIDER_SITE_OTHER): Payer: 59

## 2020-03-12 VITALS — BP 152/99 | HR 128 | Temp 98.5°F | Resp 18 | Ht 69.0 in | Wt 220.0 lb

## 2020-03-12 DIAGNOSIS — R05 Cough: Secondary | ICD-10-CM | POA: Diagnosis not present

## 2020-03-12 DIAGNOSIS — Z79899 Other long term (current) drug therapy: Secondary | ICD-10-CM | POA: Diagnosis not present

## 2020-03-12 DIAGNOSIS — R000 Tachycardia, unspecified: Secondary | ICD-10-CM | POA: Diagnosis not present

## 2020-03-12 DIAGNOSIS — H6692 Otitis media, unspecified, left ear: Secondary | ICD-10-CM

## 2020-03-12 DIAGNOSIS — R0602 Shortness of breath: Secondary | ICD-10-CM

## 2020-03-12 DIAGNOSIS — J029 Acute pharyngitis, unspecified: Secondary | ICD-10-CM | POA: Diagnosis present

## 2020-03-12 DIAGNOSIS — I10 Essential (primary) hypertension: Secondary | ICD-10-CM | POA: Diagnosis not present

## 2020-03-12 DIAGNOSIS — B349 Viral infection, unspecified: Secondary | ICD-10-CM | POA: Diagnosis not present

## 2020-03-12 LAB — CBC WITH DIFFERENTIAL/PLATELET
Abs Immature Granulocytes: 0.2 10*3/uL — ABNORMAL HIGH (ref 0.00–0.07)
Basophils Absolute: 0.1 10*3/uL (ref 0.0–0.1)
Basophils Relative: 0 %
Eosinophils Absolute: 0 10*3/uL (ref 0.0–0.5)
Eosinophils Relative: 0 %
HCT: 41.6 % (ref 36.0–46.0)
Hemoglobin: 13.8 g/dL (ref 12.0–15.0)
Immature Granulocytes: 1 %
Lymphocytes Relative: 10 %
Lymphs Abs: 2 10*3/uL (ref 0.7–4.0)
MCH: 27.3 pg (ref 26.0–34.0)
MCHC: 33.2 g/dL (ref 30.0–36.0)
MCV: 82.2 fL (ref 80.0–100.0)
Monocytes Absolute: 0.9 10*3/uL (ref 0.1–1.0)
Monocytes Relative: 5 %
Neutro Abs: 16.5 10*3/uL — ABNORMAL HIGH (ref 1.7–7.7)
Neutrophils Relative %: 84 %
Platelets: 395 10*3/uL (ref 150–400)
RBC: 5.06 MIL/uL (ref 3.87–5.11)
RDW: 13.3 % (ref 11.5–15.5)
WBC: 19.7 10*3/uL — ABNORMAL HIGH (ref 4.0–10.5)
nRBC: 0 % (ref 0.0–0.2)

## 2020-03-12 LAB — I-STAT CHEM 8, ED
BUN: 10 mg/dL (ref 6–20)
Calcium, Ion: 1.14 mmol/L — ABNORMAL LOW (ref 1.15–1.40)
Chloride: 100 mmol/L (ref 98–111)
Creatinine, Ser: 0.6 mg/dL (ref 0.44–1.00)
Glucose, Bld: 129 mg/dL — ABNORMAL HIGH (ref 70–99)
HCT: 43 % (ref 36.0–46.0)
Hemoglobin: 14.6 g/dL (ref 12.0–15.0)
Potassium: 3.8 mmol/L (ref 3.5–5.1)
Sodium: 137 mmol/L (ref 135–145)
TCO2: 24 mmol/L (ref 22–32)

## 2020-03-12 LAB — TSH: TSH: 0.437 u[IU]/mL (ref 0.350–4.500)

## 2020-03-12 LAB — I-STAT BETA HCG BLOOD, ED (MC, WL, AP ONLY): I-stat hCG, quantitative: <5 m[IU]/mL (ref <5)

## 2020-03-12 LAB — D-DIMER, QUANTITATIVE: D-Dimer, Quant: <0.27 ug/mL-FEU (ref 0.00–0.50)

## 2020-03-12 MED ORDER — ALBUTEROL SULFATE HFA 108 (90 BASE) MCG/ACT IN AERS
2.0000 | INHALATION_SPRAY | Freq: Once | RESPIRATORY_TRACT | Status: DC
  Filled 2020-03-12: qty 6.7

## 2020-03-12 MED ORDER — CEFDINIR 300 MG PO CAPS: 300.0000 mg | ORAL_CAPSULE | Freq: Two times a day (BID) | ORAL | 0 refills | Status: AC

## 2020-03-12 MED ORDER — ALBUTEROL SULFATE (2.5 MG/3ML) 0.083% IN NEBU: 5.0000 mg | INHALATION_SOLUTION | Freq: Once | RESPIRATORY_TRACT | Status: DC

## 2020-03-12 MED ORDER — SODIUM CHLORIDE 0.9 % IV BOLUS
1000.0000 mL | Freq: Once | INTRAVENOUS | Status: AC
  Administered 2020-03-12: 1000 mL via INTRAVENOUS

## 2020-03-12 MED FILL — CEFDINIR 300 MG CAPSULE: 300 | 10 days supply | Qty: 20 | Fill #0

## 2020-03-12 NOTE — ED Provider Notes (Signed)
[facility] DEPT Provider Note   CSN: [id number] Arrival date & time: 03/12/20  1332     History Chief Complaint  Patient presents with  . Sore Throat  . Otalgia  . Cough  . Tachycardia  . Hypertension    Tamara Spencer is a 29 y.o. female.  The history is provided by the patient and medical records. No language interpreter was used.  Sore Throat  Otalgia Associated symptoms: cough   Cough Associated symptoms: ear pain   Hypertension     28 year old female presenting for evaluation of cold symptoms.  Patient report for the past 2 weeks she has had complaints of runny nose, sore throat, recurrent cough that initially was dry cough followed by productive cough and now dry cough as well as feeling rundown.  Furthermore she also endorsed having increased shortness of breath with exertion.  Symptom has been persistent and is progressively worsened.  No associated fever or chills no headache no loss of taste or smell no nausea vomiting or diarrhea no chest pain or abdominal pain or dysuria.  She mention having had several rapid Covid test within the past 2 weeks and was negative.  She had an ED visit 6 days ago for her symptoms and was prescribed amoxicillin and prednisone.  She report the medication did not provide any relief.  She was seen at the urgent care today and had a Covid PCR test obtained as well as an chest x-ray that was negative.  She was found to be tachycardic and was recommended to come to the ER for evaluation.  Patient denies any prior history of PE or DVT but she is currently taking oral birth control pills.  She denies any long travel except a drive to the beach few weeks ago.  She denies any leg swelling or calf pain.  She has had Covid vaccination.  Past Medical History:  Diagnosis Date  . Headache    monthly; less severe since on new OCP  . Recurrent cold sores     Patient Active Problem List   Diagnosis Date Noted  . Indication  for care in labor and delivery, antepartum 05/23/2018  . 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 NSVD (normal spontaneous vaginal delivery) 08/07/2015  . Indication for care in labor or delivery 08/06/2015  . Genetic testing 06/14/2014  . Keloid of skin 05/10/2014  . Family history of breast cancer in mother 03/11/2014  . Elevated blood pressure 03/11/2014  . Herpes labialis 11/12/2012  . Migraine headache 10/25/2011    Past Surgical History:  Procedure Laterality Date  . KELOID EXCISION    . WISDOM TOOTH EXTRACTION       OB History    Gravida  2   Para  2   Term  2   Preterm      AB      Living  2     SAB      TAB      Ectopic      Multiple  0   Live Births  2           Family History  Problem Relation Age of Onset  . Diabetes Father   . Hypertension Father   . Hyperlipidemia Father   . Heart disease Father 80  . Kidney disease Father   . Diabetes Paternal Grandfather   . Other Mother        Myleofibrosis  . Breast cancer Mother 3  . Breast cancer Other  dx in her 22s  . Leukemia Cousin 14       mother's maternal first cousin  . Breast cancer Cousin        mother's materanl first cousin dx in her 37s    Social History   Tobacco Use  . Smoking status: Never Smoker  . Smokeless tobacco: Never Used  Vaping Use  . Vaping Use: Never used  Substance Use Topics  . Alcohol use: No  . Drug use: No    Home Medications Prior to Admission medications   Medication Sig Start Date End Date Taking? Authorizing Provider  benzonatate (TESSALON) 100 MG capsule Take 1 capsule (100 mg total) by mouth 3 (three) times daily as needed. 03/06/20   Waldon Merl, PA-C  cefdinir (OMNICEF) 300 MG capsule Take 1 capsule (300 mg total) by mouth 2 (two) times daily for 10 days. 03/12/20 03/22/20  Noe Gens, PA-C  Miconazole Nitrate 2 % AERO Apply topically in the morning and at bedtime.    [provider]  Norethindrone-Ethinyl Estradiol-Fe Biphas (LO LOESTRIN  FE) 1 MG-10 MCG / 10 MCG tablet Take 1 tablet by mouth daily.    [provider]  predniSONE (DELTASONE) 5 MG tablet Take 6 pills today, decrease by one pill daily until finished 03/06/20   Lacy Duverney M, PA-C  sertraline (ZOLOFT) 25 MG tablet Take 25 mg by mouth daily.    [provider]  terbinafine (LAMISIL) 250 MG tablet Take 1 tablet (250 mg total) by mouth daily. 11/26/19   Rita Ohara, MD  valACYclovir (VALTREX) 1000 MG tablet Take 1,000 mg by mouth 2 (two) times daily.    [provider]    Allergies    Adhesive [tape]  Review of Systems   Review of Systems  HENT: Positive for ear pain.   Respiratory: Positive for cough.   All other systems reviewed and are negative.   Physical Exam Updated Vital Signs BP (!) 165/122 (BP Location: Left Arm)   Pulse (!) 128   Temp 98.2 F (36.8 C) (Oral)   Resp 17   Ht 5\' 9"  (1.753 m)   Wt 99.8 kg   LMP 02/26/2020   SpO2 97%   BMI 32.49 kg/m   Physical Exam Vitals and nursing note reviewed.  Constitutional:      General: She is not in acute distress.    Appearance: She is well-developed. She is obese.  HENT:     Head: Atraumatic.     Nose: No congestion.     Mouth/Throat:     Mouth: Mucous membranes are moist.     Tonsils: No tonsillar exudate or tonsillar abscesses.  Eyes:     Conjunctiva/sclera: Conjunctivae normal.  Neck:     Thyroid: No thyromegaly.  Cardiovascular:     Rate and Rhythm: Tachycardia present.     Heart sounds: Normal heart sounds. No murmur heard.  No friction rub.  Pulmonary:     Effort: Pulmonary effort is normal.     Breath sounds: No wheezing, rhonchi or rales.  Abdominal:     Palpations: Abdomen is soft.     Tenderness: There is no abdominal tenderness.  Musculoskeletal:        General: No swelling or tenderness.     Cervical back: Neck supple.  Skin:    Findings: No rash.  Neurological:     Mental Status: She is alert and oriented to person, place, and time.    Psychiatric:  Mood and Affect: Mood normal.     ED Results / Procedures / Treatments   Labs (all labs ordered are listed, but only abnormal results are displayed) Labs Reviewed  CBC WITH DIFFERENTIAL/PLATELET - Abnormal; Notable for the following components:      Result Value   WBC 19.7 (*)    Neutro Abs 16.5 (*)    Abs Immature Granulocytes 0.20 (*)    All other components within normal limits  I-STAT CHEM 8, ED - Abnormal; Notable for the following components:   Glucose, Bld 129 (*)    Calcium, Ion 1.14 (*)    All other components within normal limits  D-DIMER, QUANTITATIVE (NOT AT [facility])  TSH  I-STAT BETA HCG BLOOD, ED ([facility], [facility], [facility] ONLY)    EKG None   Date: 03/12/2020  Rate: 129  Rhythm: sinus tachycardia  QRS Axis: normal  Intervals: normal  ST/T Wave abnormalities: normal  Conduction Disutrbances: none  Narrative Interpretation:   Old EKG Reviewed: No significant changes noted     Radiology DG Chest 2 View  Result Date: 03/12/2020 CLINICAL DATA:  Cough, congestion and shortness of breath. EXAM: CHEST - 2 VIEW COMPARISON:  None. FINDINGS: The heart size and mediastinal contours are within normal limits. Both lungs are clear. No pleural effusion or pneumothorax. The visualized skeletal structures are unremarkable. IMPRESSION: Normal chest radiographs. Electronically Signed   By: Lajean Manes M.D.   On: 03/12/2020 12:25    Procedures Procedures (including critical care time)  Medications Ordered in ED Medications  albuterol (VENTOLIN HFA) 108 (90 Base) MCG/ACT inhaler 2 puff (has no administration in time range)  sodium chloride 0.9 % bolus 1,000 mL (1,000 mLs Intravenous New Bag/Given (Non-Interop) 03/12/20 1517)    ED Course  I have reviewed the triage vital signs and the nursing notes.  Pertinent labs & imaging results that were available during my care of the patient were reviewed by me and considered in my medical decision making (see chart for  details).    MDM Rules/Calculators/A&P                          BP (!) 157/92   Pulse 99   Temp 98.2 F (36.8 C) (Oral)   Resp (!) 21   Ht 5\' 9"  (1.753 m)   Wt 99.8 kg   LMP 02/26/2020   SpO2 98%   BMI 32.49 kg/m   Final Clinical Impression(s) / ED Diagnoses Final diagnoses:  Viral syndrome    Rx / DC Orders ED Discharge Orders    None     2:39 PM With cold symptoms for 2 weeks, sent here due to tachycardia with heart rate of 128 as well as having shortness of breath.  She has had multiple negative Covid test and currently having a COVID-19 PCR test pending.  She had a negative chest x-ray performed at the urgent care earlier today.  Plan to obtain D-dimer to assess for potential DVT or PE, as well as checking TSH.  We will give IV fluid and check basic labs.  5:16 PM White count is elevated at 19.7 however this is likely related to recent prednisone use.  Fortunately her D-dimer is negative therefore low suspicion for PE.  TSH is normal, doubt thyroid disease.  No evidence of anemia.  Heart rate improved with IV fluid.  At this time, will discharge home.  Albuterol provided to use as needed for shortness of breath.  Return  precaution discussed.'  Tamara Spencer was evaluated in Emergency Department on 03/12/2020 for the symptoms described in the history of present illness. She was evaluated in the context of the global COVID-19 pandemic, which necessitated consideration that the patient might be at risk for infection with the SARS-CoV-2 virus that causes COVID-19. Institutional protocols and algorithms that pertain to the evaluation of patients at risk for COVID-19 are in a state of rapid change based on information released by regulatory bodies including the CDC and federal and state organizations. These policies and algorithms were followed during the patient's care in the ED.    Domenic Moras, PA-C 03/12/20 [place], Ankit, MD 03/12/20 2242

## 2020-03-12 NOTE — Discharge Instructions (Signed)
You have been evaluated for your symptoms.  Fortunately your labs did not support signs of a blood clot in your lungs.  Your thyroid function is normal.  Your white count is elevated but this is likely due to recent use of prednisone.  Use albuterol inhaler 2 puffs every 4 hours as needed for shortness of breath.  Stay hydrated, rest, and check your Covid labs result through MyChart within the next 24 hours.  Return to the ER if you have any concern.

## 2020-03-12 NOTE — ED Triage Notes (Signed)
Patient began having a sore throat, cough, and body aches 2 weeks ago. Patient did an E visit 6 days ago and was prescribed Amoxicillin and Prednisone. Patient states she has 3 negative Rapid tests. Patient states she has a pending Covid PCR test  That was completed at the UC today.   HR- 130-140 in triage. Patient states she has had increased SOB in the past week, especially with exertion. Patient states it is getting harder to take a deep breath.

## 2020-03-12 NOTE — Discharge Instructions (Addendum)
   You have declined EMS transport. Please drive yourself directly to the emergency department for further evaluation and treatment of your shortness of breath and elevated heart rate.

## 2020-03-12 NOTE — ED Triage Notes (Signed)
Pt c/o cough x 2 weeks. Sore throat and bodyaches a few days after. E Visit Sunday, rx'd amoxicillin, tessalon and prednisone. Last dose of prednisone today. Severe cold and flu prn. Thurs fever. None today. At home Covid test neg on Thurs. 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

## 2020-03-12 NOTE — ED Provider Notes (Signed)
[facility]    CSN: [id number] Arrival date & time: 03/12/20  1041      History   Chief Complaint Chief Complaint  Patient presents with  . Sore Throat  . Shortness of Breath    Chills    HPI Tamara Spencer is a 29 y.o. female.   HPI Tamara Spencer is a 29 y.o. female presenting to UC with c/o 2 weeks of worsening cough, congestion, sore throat and body aches. She has also developed Left ear pain. She did an Evisit 1 week ago, prescribed amoxicillin 500mg  BID for 10 days along with prednisone. She completed the prednisone yesterday but does not feel any better.  She had a negative COVID test at home 2 days ago.  She received both doses of Pfizer COVID vaccine in Jan/Feb 2021. Denies fever, chills, n/v/d. She has been taking OTC cough/cold medication. Denies hx of blood clots. No leg pain or swelling. No chest pain at this time.    Past Medical History:  Diagnosis Date  . Headache    monthly; less severe since on new OCP  . Recurrent cold sores     Patient Active Problem List   Diagnosis Date Noted  . Indication for care in labor and delivery, antepartum 05/23/2018  . 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 NSVD (normal spontaneous vaginal delivery) 08/07/2015  . Indication for care in labor or delivery 08/06/2015  . Genetic testing 06/14/2014  . Keloid of skin 05/10/2014  . Family history of breast cancer in mother 03/11/2014  . Elevated blood pressure 03/11/2014  . Herpes labialis 11/12/2012  . Migraine headache 10/25/2011    Past Surgical History:  Procedure Laterality Date  . KELOID EXCISION    . WISDOM TOOTH EXTRACTION      OB History    Gravida  2   Para  2   Term  2   Preterm      AB      Living  2     SAB      TAB      Ectopic      Multiple  0   Live Births  2            Home Medications    Prior to Admission medications   Medication Sig Start Date End Date Taking? Authorizing Provider  benzonatate (TESSALON) 100 MG capsule Take 1  capsule (100 mg total) by mouth 3 (three) times daily as needed. 03/06/20   Waldon Merl, PA-C  cefdinir (OMNICEF) 300 MG capsule Take 1 capsule (300 mg total) by mouth 2 (two) times daily for 10 days. 03/12/20 03/22/20  Noe Gens, PA-C  Miconazole Nitrate 2 % AERO Apply topically in the morning and at bedtime.    [provider]  Norethindrone-Ethinyl Estradiol-Fe Biphas (LO LOESTRIN FE) 1 MG-10 MCG / 10 MCG tablet Take 1 tablet by mouth daily.    [provider]  predniSONE (DELTASONE) 5 MG tablet Take 6 pills today, decrease by one pill daily until finished 03/06/20   Lacy Duverney M, PA-C  sertraline (ZOLOFT) 25 MG tablet Take 25 mg by mouth daily.    [provider]  terbinafine (LAMISIL) 250 MG tablet Take 1 tablet (250 mg total) by mouth daily. 11/26/19   Rita Ohara, MD  valACYclovir (VALTREX) 1000 MG tablet Take 1,000 mg by mouth 2 (two) times daily.    [provider]    Family History Family History  Problem Relation Age of Onset  .  Diabetes Father   . Hypertension Father   . Hyperlipidemia Father   . Heart disease Father 102  . Kidney disease Father   . Diabetes Paternal Grandfather   . Other Mother        Myleofibrosis  . Breast cancer Mother 45  . Breast cancer Other        dx in her 46s  . Leukemia Cousin 14       mother's maternal first cousin  . Breast cancer Cousin        mother's materanl first cousin dx in her 17s    Social History Social History   Tobacco Use  . Smoking status: Never Smoker  . Smokeless tobacco: Never Used  Vaping Use  . Vaping Use: Never used  Substance Use Topics  . Alcohol use: No  . Drug use: No     Allergies   Adhesive [tape]   Review of Systems Review of Systems  Constitutional: Negative for chills and fever.  HENT: Positive for congestion, ear pain and sore throat. Negative for trouble swallowing and voice change.   Respiratory: Positive for cough and shortness of breath.    Cardiovascular: Negative for chest pain and palpitations.  Gastrointestinal: Negative for abdominal pain, diarrhea, nausea and vomiting.  Musculoskeletal: Positive for arthralgias, back pain and myalgias.  Skin: Negative for rash.  Neurological: Negative for dizziness, light-headedness and headaches.  All other systems reviewed and are negative.    Physical Exam Triage Vital Signs ED Triage Vitals  Enc Vitals Group     BP 03/12/20 1140 (!) 152/99     Pulse Rate 03/12/20 1140 (!) 128     Resp 03/12/20 1140 18     Temp 03/12/20 1140 98.5 F (36.9 C)     Temp Source 03/12/20 1140 Oral     SpO2 03/12/20 1140 98 %     Weight 03/12/20 1142 220 lb (99.8 kg)     Height 03/12/20 1142 5\' 9"  (1.753 m)     Head Circumference --      Peak Flow --      Pain Score 03/12/20 1141 4     Pain Loc --      Pain Edu? --      Excl. in [place]? --    No data found.  Updated Vital Signs BP (!) 152/99 (BP Location: Right Arm)   Pulse (!) 128   Temp 98.5 F (36.9 C) (Oral)   Resp 18   Ht 5\' 9"  (1.753 m)   Wt 220 lb (99.8 kg)   LMP 02/26/2020   SpO2 98%   BMI 32.49 kg/m   Visual Acuity Right Eye Distance:   Left Eye Distance:   Bilateral Distance:    Right Eye Near:   Left Eye Near:    Bilateral Near:     Physical Exam Vitals and nursing note reviewed.  Constitutional:      Appearance: She is well-developed.  HENT:     Head: Normocephalic and atraumatic.     Right Ear: Tympanic membrane and ear canal normal.     Left Ear: No drainage, swelling or tenderness.  No middle ear effusion. Tympanic membrane is erythematous.     Nose: No congestion.  Cardiovascular:     Rate and Rhythm: Regular rhythm. Tachycardia present.  Pulmonary:     Effort: Pulmonary effort is normal. No respiratory distress.     Breath sounds: No stridor. Rhonchi ( bilateral lower lung fields) present. No wheezing or rales.  Musculoskeletal:        General: Normal range of motion.     Cervical back: Normal range  of motion and neck supple.  Lymphadenopathy:     Cervical: No cervical adenopathy.  Skin:    General: Skin is warm and dry.  Neurological:     Mental Status: She is alert and oriented to person, place, and time.  Psychiatric:        Behavior: Behavior normal.      UC Treatments / Results  Labs (all labs ordered are listed, but only abnormal results are displayed) Labs Reviewed  SARS-COV-2 RNA,(COVID-19) QUALITATIVE NAAT    EKG   Radiology DG Chest 2 View  Result Date: 03/12/2020 CLINICAL DATA:  Cough, congestion and shortness of breath. EXAM: CHEST - 2 VIEW COMPARISON:  None. FINDINGS: The heart size and mediastinal contours are within normal limits. Both lungs are clear. No pleural effusion or pneumothorax. The visualized skeletal structures are unremarkable. IMPRESSION: Normal chest radiographs. Electronically Signed   By: Lajean Manes M.D.   On: 03/12/2020 12:25    Procedures Procedures (including critical care time)  Medications Ordered in UC Medications - No data to display  Initial Impression / Assessment and Plan / UC Course  I have reviewed the triage vital signs and the nursing notes.  Pertinent labs & imaging results that were available during my care of the patient were reviewed by me and considered in my medical decision making (see chart for details).     CXR: normal Left ear c/w AOM O2 sat 98% on RA, however, HR 120s.  Concern for possible PE given continued SOB despite normal CXR, amoxicillin and prednisone Recommend further evaluation in emergency department Pt understanding and agreeable with plan. Declined EMS transport Pt discharged in stable condition. Final Clinical Impressions(s) / UC Diagnoses   Final diagnoses:  Shortness of breath  Tachycardia  Left acute otitis media     Discharge Instructions       You have declined EMS transport. Please drive yourself directly to the emergency department for further evaluation and treatment of  your shortness of breath and elevated heart rate.      ED Prescriptions    Medication Sig Dispense Auth. Provider   cefdinir (OMNICEF) 300 MG capsule Take 1 capsule (300 mg total) by mouth 2 (two) times daily for 10 days. 20 capsule Noe Gens, PA-C     PDMP not reviewed this encounter.   Noe Gens, PA-C 03/12/20 1245

## 2020-03-13 LAB — SARS-COV-2 RNA,(COVID-19) QUALITATIVE NAAT: SARS CoV2 RNA: NOT DETECTED

## 2020-04-08 ENCOUNTER — Other Ambulatory Visit: Payer: Self-pay | Admitting: Emergency Medicine

## 2020-04-08 ENCOUNTER — Telehealth: Payer: 59 | Admitting: Emergency Medicine

## 2020-04-08 DIAGNOSIS — N898 Other specified noninflammatory disorders of vagina: Secondary | ICD-10-CM

## 2020-04-08 MED ORDER — FLUCONAZOLE 150 MG PO TABS: 150.0000 mg | ORAL_TABLET | Freq: Once | ORAL | 0 refills | Status: DC

## 2020-04-08 MED FILL — SERTRALINE HCL 25 MG TABLET: 25 | 30 days supply | Qty: 30 | Fill #5

## 2020-04-08 MED FILL — FLUCONAZOLE 150 MG TABS: 150 | 1 days supply | Qty: 1 | Fill #0

## 2020-04-08 NOTE — Progress Notes (Signed)
We are sorry that you are not feeling well. Here is how we plan to help! Based on what you shared with me it looks like you: May have a yeast vaginosis  Vaginosis is an inflammation of the vagina that can result in discharge, itching and pain. The cause is usually a change in the normal balance of vaginal bacteria or an infection. Vaginosis can also result from reduced estrogen levels after menopause.  The most common causes of vaginosis are:   Bacterial vaginosis which results from an overgrowth of one on several organisms that are normally present in your vagina.   Yeast infections which are caused by a naturally occurring fungus called candida.   Vaginal atrophy (atrophic vaginosis) which results from the thinning of the vagina from reduced estrogen levels after menopause.   Trichomoniasis which is caused by a parasite and is commonly transmitted by sexual intercourse.  Factors that increase your risk of developing vaginosis include: . Medications, such as antibiotics and steroids . Uncontrolled diabetes . Use of hygiene products such as bubble bath, vaginal spray or vaginal deodorant . Douching . Wearing damp or tight-fitting clothing . Using an intrauterine device (IUD) for birth control . Hormonal changes, such as those associated with pregnancy, birth control pills or menopause . Sexual activity . Having a sexually transmitted infection  Your treatment plan is A single Diflucan (fluconazole) 150mg tablet once.  I have electronically sent this prescription into the pharmacy that you have chosen.  Be sure to take all of the medication as directed. Stop taking any medication if you develop a rash, tongue swelling or shortness of breath. Mothers who are breast feeding should consider pumping and discarding their breast milk while on these antibiotics. However, there is no consensus that infant exposure at these doses would be harmful.  Remember that medication creams can weaken latex  condoms. .   HOME CARE:  Good hygiene may prevent some types of vaginosis from recurring and may relieve some symptoms:  . Avoid baths, hot tubs and whirlpool spas. Rinse soap from your outer genital area after a shower, and dry the area well to prevent irritation. Don't use scented or harsh soaps, such as those with deodorant or antibacterial action. . Avoid irritants. These include scented tampons and pads. . Wipe from front to back after using the toilet. Doing so avoids spreading fecal bacteria to your vagina.  Other things that may help prevent vaginosis include:  . Don't douche. Your vagina doesn't require cleansing other than normal bathing. Repetitive douching disrupts the normal organisms that reside in the vagina and can actually increase your risk of vaginal infection. Douching won't clear up a vaginal infection. . Use a latex condom. Both female and female latex condoms may help you avoid infections spread by sexual contact. . Wear cotton underwear. Also wear pantyhose with a cotton crotch. If you feel comfortable without it, skip wearing underwear to bed. Yeast thrives in moist environments Your symptoms should improve in the next day or two.  GET HELP RIGHT AWAY IF:  . You have pain in your lower abdomen ( pelvic area or over your ovaries) . You develop nausea or vomiting . You develop a fever . Your discharge changes or worsens . You have persistent pain with intercourse . You develop shortness of breath, a rapid pulse, or you faint.  These symptoms could be signs of problems or infections that need to be evaluated by a medical provider now.  MAKE SURE YOU      Understand these instructions.  Will watch your condition.  Will get help right away if you are not doing well or get worse.  Your e-visit answers were reviewed by a board certified advanced clinical practitioner to complete your personal care plan. Depending upon the condition, your plan could have included  both over the counter or prescription medications. Please review your pharmacy choice to make sure that you have choses a pharmacy that is open for you to pick up any needed prescription, Your safety is important to us. If you have drug allergies check your prescription carefully.   You can use MyChart to ask questions about today's visit, request a non-urgent call back, or ask for a work or school excuse for 24 hours related to this e-Visit. If it has been greater than 24 hours you will need to follow up with your provider, or enter a new e-Visit to address those concerns. You will get a MyChart message within the next two days asking about your experience. I hope that your e-visit has been valuable and will speed your recovery.  Approximately 5 minutes was used in reviewing the patient's chart, questionnaire, prescribing medications, and documentation.  

## 2020-05-21 MED FILL — SERTRALINE HCL 25 MG TABLET: 25 | 30 days supply | Qty: 30 | Fill #6

## 2020-05-26 ENCOUNTER — Telehealth: Payer: 59 | Admitting: Orthopedic Surgery

## 2020-05-26 DIAGNOSIS — R399 Unspecified symptoms and signs involving the genitourinary system: Secondary | ICD-10-CM | POA: Diagnosis not present

## 2020-05-26 MED ORDER — NITROFURANTOIN MONOHYD MACRO 100 MG PO CAPS: 100.0000 mg | ORAL_CAPSULE | Freq: Two times a day (BID) | ORAL | 0 refills | Status: AC

## 2020-05-26 NOTE — Progress Notes (Signed)
We are sorry that you are not feeling well.  Here is how we plan to help!  Based on what you shared with me it looks like you most likely have a simple urinary tract infection.  A UTI (Urinary Tract Infection) is a bacterial infection of the bladder.  Most cases of urinary tract infections are simple to treat but a key part of your care is to encourage you to drink plenty of fluids and watch your symptoms carefully.  I have prescribed MacroBid 100 mg twice a day for 5 days.  Your symptoms should gradually improve. Call us if the burning in your urine worsens, you develop worsening fever, back pain or pelvic pain or if your symptoms do not resolve after completing the antibiotic.  If you are not feeling better after 2 days on the antibiotic I would definitely make an appointment with your GYN.  Urinary tract infections can be prevented by drinking plenty of water to keep your body hydrated.  Also be sure when you wipe, wipe from front to back and don't hold it in!  If possible, empty your bladder every 4 hours.  Your e-visit answers were reviewed by a board certified advanced clinical practitioner to complete your personal care plan.  Depending on the condition, your plan could have included both over the counter or prescription medications.  If there is a problem please reply  once you have received a response from your provider.  Your safety is important to [place].  If you have drug allergies check your prescription carefully.    You can use MyChart to ask questions about today's visit, request a non-urgent call back, or ask for a work or school excuse for 24 hours related to this e-Visit. If it has been greater than 24 hours you will need to follow up with your provider, or enter a new e-Visit to address those concerns.   You will get an e-mail in the next two days asking about your experience.  I hope that your e-visit has been valuable and will speed your recovery. Thank you for using  e-visits.  Greater than 5 minutes, yet less than 10 minutes of time have been spent researching, coordinating and implementing care for this patient today.

## 2020-06-14 DIAGNOSIS — H5213 Myopia, bilateral: Secondary | ICD-10-CM | POA: Diagnosis not present

## 2020-08-24 ENCOUNTER — Other Ambulatory Visit (HOSPITAL_COMMUNITY): Payer: Self-pay | Admitting: Obstetrics and Gynecology

## 2020-08-24 DIAGNOSIS — Z13228 Encounter for screening for other metabolic disorders: Secondary | ICD-10-CM | POA: Diagnosis not present

## 2020-08-24 DIAGNOSIS — Z1329 Encounter for screening for other suspected endocrine disorder: Secondary | ICD-10-CM | POA: Diagnosis not present

## 2020-08-24 DIAGNOSIS — Z6837 Body mass index (BMI) 37.0-37.9, adult: Secondary | ICD-10-CM | POA: Diagnosis not present

## 2020-08-24 DIAGNOSIS — Z13220 Encounter for screening for lipoid disorders: Secondary | ICD-10-CM | POA: Diagnosis not present

## 2020-08-24 DIAGNOSIS — Z01419 Encounter for gynecological examination (general) (routine) without abnormal findings: Secondary | ICD-10-CM | POA: Diagnosis not present

## 2020-08-24 MED FILL — valACYclovir HCL 1 GM TABS: 1 | 7 days supply | Qty: 30 | Fill #0

## 2020-08-24 MED FILL — VYLIBRA 0.25-35 MG-MCG TABS: 0.25-35 | 84 days supply | Qty: 84 | Fill #0

## 2020-08-24 MED FILL — SERTRALINE HCL 25 MG TABLET: 25 | 90 days supply | Qty: 90 | Fill #0

## 2020-09-05 ENCOUNTER — Telehealth: Payer: Self-pay | Admitting: Family Medicine

## 2020-09-05 NOTE — Telephone Encounter (Signed)
Received requested records from [facility]  

## 2020-09-12 ENCOUNTER — Encounter: Payer: Self-pay | Admitting: Family Medicine

## 2020-09-21 ENCOUNTER — Encounter: Payer: 59 | Admitting: Family Medicine

## 2020-11-06 ENCOUNTER — Telehealth: Payer: 59 | Admitting: Physician Assistant

## 2020-11-06 ENCOUNTER — Encounter: Payer: Self-pay | Admitting: Physician Assistant

## 2020-11-06 DIAGNOSIS — N390 Urinary tract infection, site not specified: Secondary | ICD-10-CM

## 2020-11-06 MED ORDER — NITROFURANTOIN MONOHYD MACRO 100 MG PO CAPS
100.0000 mg | ORAL_CAPSULE | Freq: Two times a day (BID) | ORAL | 0 refills | Status: DC
  Filled 2020-11-06: qty 10, 5d supply, fill #0

## 2020-11-06 MED FILL — Sertraline HCl Tab 25 MG: ORAL | 90 days supply | Qty: 90 | Fill #0 | Status: AC

## 2020-11-06 MED FILL — Norgestimate & Ethinyl Estradiol Tab 0.25 MG-35 MCG: ORAL | 84 days supply | Qty: 84 | Fill #0 | Status: AC

## 2020-11-06 NOTE — Progress Notes (Signed)
We are sorry that you are not feeling well.  Here is how we plan to help!  Based on what you shared with me it looks like you most likely have a simple urinary tract infection.  A UTI (Urinary Tract Infection) is a bacterial infection of the bladder.  Most cases of urinary tract infections are simple to treat but a key part of your care is to encourage you to drink plenty of fluids and watch your symptoms carefully.  I have prescribed MacroBid 100 mg twice a day for 5 days.  Your symptoms should gradually improve. Call us if the burning in your urine worsens, you develop worsening fever, back pain or pelvic pain or if your symptoms do not resolve after completing the antibiotic.   [name],  It appears that you had the similar UTI approx 6 months ago. Recurrent UTIs warrant for further workup to include possible labs to ensure the specific bacteria is treated appropriately. If your current symptoms don't improve please have a face to face visit for further evaluation.    Urinary tract infections can be prevented by drinking plenty of water to keep your body hydrated.  Also be sure when you wipe, wipe from front to back and don't hold it in!  If possible, empty your bladder every 4 hours.  Your e-visit answers were reviewed by a board certified advanced clinical practitioner to complete your personal care plan.  Depending on the condition, your plan could have included both over the counter or prescription medications.  If there is a problem please reply  once you have received a response from your provider.  Your safety is important to [place].  If you have drug allergies check your prescription carefully.    You can use MyChart to ask questions about today's visit, request a non-urgent call back, or ask for a work or school excuse for 24 hours related to this e-Visit. If it has been greater than 24 hours you will need to follow up with your provider, or enter a new e-Visit to address those  concerns.   You will get an e-mail in the next two days asking about your experience.  I hope that your e-visit has been valuable and will speed your recovery. Thank you for using e-visits.   I spent 5-10 minutes on review and completion of this note- Tamara Spencer [facility]

## 2020-11-07 ENCOUNTER — Other Ambulatory Visit (HOSPITAL_COMMUNITY): Payer: Self-pay

## 2020-12-06 DIAGNOSIS — L304 Erythema intertrigo: Secondary | ICD-10-CM | POA: Diagnosis not present

## 2020-12-06 DIAGNOSIS — D2261 Melanocytic nevi of right upper limb, including shoulder: Secondary | ICD-10-CM | POA: Diagnosis not present

## 2020-12-06 DIAGNOSIS — L814 Other melanin hyperpigmentation: Secondary | ICD-10-CM | POA: Diagnosis not present

## 2020-12-06 DIAGNOSIS — D2262 Melanocytic nevi of left upper limb, including shoulder: Secondary | ICD-10-CM | POA: Diagnosis not present

## 2020-12-06 DIAGNOSIS — D2272 Melanocytic nevi of left lower limb, including hip: Secondary | ICD-10-CM | POA: Diagnosis not present

## 2020-12-06 DIAGNOSIS — L905 Scar conditions and fibrosis of skin: Secondary | ICD-10-CM | POA: Diagnosis not present

## 2020-12-06 DIAGNOSIS — D2271 Melanocytic nevi of right lower limb, including hip: Secondary | ICD-10-CM | POA: Diagnosis not present

## 2020-12-06 DIAGNOSIS — D2361 Other benign neoplasm of skin of right upper limb, including shoulder: Secondary | ICD-10-CM | POA: Diagnosis not present

## 2020-12-06 DIAGNOSIS — D225 Melanocytic nevi of trunk: Secondary | ICD-10-CM | POA: Diagnosis not present

## 2021-01-06 ENCOUNTER — Other Ambulatory Visit (HOSPITAL_COMMUNITY): Payer: Self-pay

## 2021-01-11 DIAGNOSIS — I10 Essential (primary) hypertension: Secondary | ICD-10-CM | POA: Diagnosis not present

## 2021-02-02 ENCOUNTER — Encounter: Payer: Self-pay | Admitting: Family Medicine

## 2021-03-07 NOTE — Patient Instructions (Addendum)
  HEALTH MAINTENANCE RECOMMENDATIONS:  It is recommended that you get at least 30 minutes of aerobic exercise at least 5 days/week (for weight loss, you may need as much as 60-90 minutes). This can be any activity that gets your heart rate up. This can be divided in 10-15 minute intervals if needed, but try and build up your endurance at least once a week.  Weight bearing exercise is also recommended twice weekly.  Eat a healthy diet with lots of vegetables, fruits and fiber.  "Colorful" foods have a lot of vitamins (ie green vegetables, tomatoes, red peppers, etc).  Limit sweet tea, regular sodas and alcoholic beverages, all of which has a lot of calories and sugar.  Up to 1 alcoholic drink daily may be beneficial for women (unless trying to lose weight, watch sugars).  Drink a lot of water.  Calcium recommendations are 1200-1500 mg daily (1500 mg for postmenopausal women or women without ovaries), and vitamin D 1000 IU daily.  This should be obtained from diet and/or supplements (vitamins), and calcium should not be taken all at once, but in divided doses.  Monthly self breast exams and yearly mammograms for women over the age of 41 is recommended.  Sunscreen of at least SPF 30 should be used on all sun-exposed parts of the skin when outside between the hours of 10 am and 4 pm (not just when at beach or pool, but even with exercise, golf, tennis, and yard work!)  Use a sunscreen that says "broad spectrum" so it covers both UVA and UVB rays, and make sure to reapply every 1-2 hours.  Remember to change the batteries in your smoke detectors when changing your clock times in the spring and fall. Carbon monoxide detectors are recommended for your home.  Use your seat belt every time you are in a car, and please drive safely and not be distracted with cell phones and texting while driving.  I recommend getting the updated COVID vaccine when available later this Fall (with better coverage for the  variants).   Try and get more protein in your diet--snack on greek yogurt, apples with peanut butter as a snack, add protein powder to a smoothie, egg whites. Limit sodium in the diet.  Return sooner than 4-6 months if BP's are consistently >140/90. Or if you are struggling with weight loss. I expect they will continue to improve with exercise, weight loss and low sodium diet.  1000 IU of vitamin D3 is recommended daily. We are checking this today and will let you know if you need more or any prescription.  Continue the daily supplement longterm.

## 2021-03-07 NOTE — Progress Notes (Signed)
Chief Complaint  Patient presents with   Annual Exam    Fasting annual exam. Sees Dr. Matthew Saras for pap and sees eye doctor. Has had some elevated bp readings-she has been keeping track but left the paper at home. She has a general idea of what the readings are. Feels tired a lot, thinks it is related to being overweight. Will get flu shot through [facility].    Tamara Spencer is a 30 y.o. female who presents for a complete physical.  She sees GYN. She has the following concerns:  Weight gain, fatigue, higher blood pressures. She messaged [place] the end of July reporting that her weight was up to 255#, couldn't keep up with her kids. She sees her GYN regularly, for regular visit in 08/2020 (where she reports having had normal fingerstick Hgb, and normal TSH), and due to high blood pressures, followed up with him more recently (June/July).  She reports that since eating better and exercising, blood pressure is improving (she forgot her list). BP 135/90 this morning.  Had been 170/high 90's, now better.  Joined [employer], going 3x/week since end of July. Her kids like the daycare there. She also exercises 2 days/week at home, doing Zumba or other videos. Lost 5# this month She restarted [employer], which worked well for her in the past, likes tracking her points. She feels a lot better--can play with her kids again. She cut back on sodas, only 1/day, now diet (previously 1-2 regular sodas/d). She does report eating more soups, since scanning foods is easy for [employer]. Hasn't been paying attention to sodium content in foods.   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  She continues to see a therapist.  Tried to wean off, wasn't ready, had recurrent anxiety, irritability. She denies side effects.  History of vitamin D deficiency.  Level was 26 in 2018.  Last level was 30.1 in 02/2019. She is not currently taking any vitamin D supplement.   Immunization History  Administered Date(s)  Administered   HPV Quadrivalent 02/02/2011, 03/15/2011, 07/06/2011   Influenza Split 04/08/2012, 04/12/2014   Influenza Whole 04/15/2013   Influenza,inj,Quad PF,6+ Mos 03/02/2019   Influenza-Unspecified 04/20/2016, 04/15/2017, 04/28/2018, 03/23/2020   Td 07/09/2002   Tdap 04/11/2009, 02/26/2018  Had COVID in 07/2020.  Had 2 COVID vaccines ([employer], no booster. [place] Last mammogram: never, scheduled for today. Has had genetic testing for breast cancer. Last colonoscopy: never Last DEXA: never Dentist: twice yearly Ophtho: yearly Exercise: Gym 3x week--30+ mins card, some weights. Carries 2 and 5 31o children. Zumba videos or other activity 2x/week. Lipid: Lab Results  Component Value Date   CHOL 140 03/02/2019   HDL 43 03/02/2019   LDLCALC 87 03/02/2019   TRIG 52 03/02/2019   CHOLHDL 3.3 03/02/2019   Thyroid screen: Lab Results  Component Value Date   TSH 0.437 03/12/2020  Had thyroid tests repeated by Dr. HoMatthew Sarasn 08/2020, normal.   PMH, [place] FH were reviewed and updated.  Outpatient Encounter Medications as of 03/09/2021  Medication Sig Note   norgestimate-ethinyl estradiol (ORTHO-CYCLEN) 0.25-35 MG-MCG tablet TAKE 1 TABLET BY MOUTH DAILY    sertraline (ZOLOFT) 25 MG tablet TAKE 1 TABLET BY MOUTH ONCE DAILY    valACYclovir (VALTREX) 1000 MG tablet TAKE 2 TABLETS BY MOUTH EVERY 12 HOURS FOR 1 DAY (Patient not taking: Reported on 03/09/2021)    [DISCONTINUED] benzonatate (TESSALON) 100 MG capsule  Take 1 capsule (100 mg total) by mouth 3 (three) times daily as needed.    [DISCONTINUED] Miconazole Nitrate 2 % AERO Apply topically in the morning and at bedtime. 10/07/2019: This is the Lotrimin spray she has been using   [DISCONTINUED] nitrofurantoin, macrocrystal-monohydrate, (MACROBID) 100 MG capsule Take 1 capsule (100 mg total) by mouth 2 (two) times daily.    [DISCONTINUED] Norethindrone-Ethinyl Estradiol-Fe Biphas  (LO LOESTRIN FE) 1 MG-10 MCG / 10 MCG tablet Take 1 tablet by mouth daily.    [DISCONTINUED] predniSONE (DELTASONE) 5 MG tablet Take 6 pills today, decrease by one pill daily until finished    [DISCONTINUED] sertraline (ZOLOFT) 25 MG tablet Take 25 mg by mouth daily.    [DISCONTINUED] sertraline (ZOLOFT) 25 MG tablet TAKE 1 TABLET BY MOUTH ONCE DAILY    [DISCONTINUED] terbinafine (LAMISIL) 250 MG tablet Take 1 tablet (250 mg total) by mouth daily.    [DISCONTINUED] valACYclovir (VALTREX) 1000 MG tablet Take 1,000 mg by mouth 2 (two) times daily.    No facility-administered encounter medications on file as of 03/09/2021.   Allergies  Allergen Reactions   Adhesive [Tape] Rash    ROS:  The patient denies anorexia, fever, vision changes, decreased hearing, ear pain, sore throat, breast concerns, chest pain, palpitations, dizziness, syncope, dyspnea on exertion (resolved--noted when inactive and heavier), cough, swelling, nausea, vomiting, diarrhea, constipation, abdominal pain, melena, hematochezia, indigestion/heartburn, hematuria, incontinence, dysuria, irregular menstrual cycles, vaginal discharge, odor or itch, genital lesions, joint pains, numbness, tingling, weakness, tremor, suspicious skin lesions, depression, abnormal bleeding/bruising, or enlarged lymph nodes. Anxiety per HPI, controlled. Headaches when BP was very high, improved. Weight gain per HPI (30# in the last year)    PHYSICAL EXAM:  BP 130/88   Pulse 72   Ht 5' 9" (1.753 m)   Wt 250 lb 4.8 oz (113.5 kg)   LMP 03/07/2021 (Exact Date)   BMI 36.96 kg/m   Wt Readings from Last 3 Encounters:  03/09/21 250 lb 4.8 oz (113.5 kg)  03/12/20 220 lb (99.8 kg)  03/12/20 220 lb (99.8 kg)   General Appearance:    Alert, cooperative, no distress, appears stated age  Head:    Normocephalic, without obvious abnormality, atraumatic  Eyes:    PERRL, conjunctiva/corneas clear, EOM's intact, fundi    Benign. Right iris--brown color  superiorly, as well as a brown spot at 4 o'clock position, unchanged  Ears:    Normal TM's and external ear canals  Nose:   Not examined (wearing mask due to COVID-19 pandemic)  Throat:   Not examined (wearing mask due to COVID-19 pandemic)  Neck:   Supple, no lymphadenopathy;  thyroid:  no enlargement/ tenderness/nodules; no carotidbruit or JVD  Back:    Spine nontender, no curvature, ROM normal, no CVA  tenderness  Lungs:     Clear to auscultation bilaterally without wheezes, rales or  ronchi; respirations unlabored  Chest Wall:    No tenderness or deformity   Heart:    Regular rate and rhythm, S1 and S2 normal, no rub or gallop. Faint murmur noted at RUSB.  Breast Exam:    Deferred to GYN  Abdomen:     Soft, non-tender, nondistended, normoactive bowel sounds,    no masses, no hepatosplenomegaly  Genitalia:    Deferred to GYN       Extremities:   No clubbing, cyanosis or edema  Pulses:   2+ and symmetric all extremities  Skin:   Skin color, texture, turgor normal. Hypertrophic scar left  shoulder/upper arm.    Lymph nodes:   Cervical, supraclavicular, and inguinal nodes normal  Neurologic:   Normal strength, sensation and gait; reflexes 2+ and symmetric throughout                              Psych:   Normal mood, affect, hygiene and grooming.               GAD-7 score of 5   ASSESSMENT/PLAN:  Annual physical exam - Plan: POCT Urinalysis DIP (Proadvantage Device), CBC with Differential/Platelet, Comprehensive metabolic panel, VITAMIN D 25 Hydroxy (Vit-D Deficiency, Fractures)  Blood pressure elevated without history of HTN - Improved since exercising, eating better. Revewed low sodium diet and normal ranges. f/u if persistently elevated  Vitamin D deficiency - suspect would be low/borderline now, and drop lower in winter. Encouraged her to take daily supplement of 1000 IU (declined level).  BMI 36.0-36.9,adult - counseled in detail re: diet, exercise.  To increase protein intake,  healthy snacks and portions reviewed. f/u to discuss meds if not successful w/ [employer]   Cbc, c-met, vit D (pt is fasting) Declined lipids  Discussed monthly self breast exams and yearly mammograms--scheduled for today; at least 30 minutes of aerobic activity at least 5 days/week, weight-bearing exercise at least 2x/week; proper sunscreen use reviewed; healthy diet, including goals of calcium and vitamin D intake and alcohol recommendations (less than or equal to 1 drink/day) reviewed; regular seatbelt use; changing batteries in smoke detectors.  Immunization recommendations discussed, UTD. Continue yearly flu shots (prefers to get through work/[facility]). Updated COVID vaccine recommended when available later this Fall. Colonoscopy recommendations reviewed (age 57)  F/u 4-6 months on weight/BP, sooner prn if BP's running >140/90.

## 2021-03-09 ENCOUNTER — Encounter: Payer: Self-pay | Admitting: Family Medicine

## 2021-03-09 ENCOUNTER — Other Ambulatory Visit: Payer: Self-pay

## 2021-03-09 ENCOUNTER — Ambulatory Visit (INDEPENDENT_AMBULATORY_CARE_PROVIDER_SITE_OTHER): Payer: 59 | Admitting: Family Medicine

## 2021-03-09 VITALS — BP 130/88 | HR 72 | Ht 69.0 in | Wt 250.3 lb

## 2021-03-09 DIAGNOSIS — Z1239 Encounter for other screening for malignant neoplasm of breast: Secondary | ICD-10-CM | POA: Diagnosis not present

## 2021-03-09 DIAGNOSIS — Z0000 Encounter for general adult medical examination without abnormal findings: Secondary | ICD-10-CM

## 2021-03-09 DIAGNOSIS — E559 Vitamin D deficiency, unspecified: Secondary | ICD-10-CM | POA: Diagnosis not present

## 2021-03-09 DIAGNOSIS — R030 Elevated blood-pressure reading, without diagnosis of hypertension: Secondary | ICD-10-CM

## 2021-03-09 DIAGNOSIS — Z6836 Body mass index (BMI) 36.0-36.9, adult: Secondary | ICD-10-CM | POA: Diagnosis not present

## 2021-03-09 LAB — POCT URINALYSIS DIP (PROADVANTAGE DEVICE)
Glucose, UA: NEGATIVE mg/dL
Ketones, POC UA: NEGATIVE mg/dL
Leukocytes, UA: NEGATIVE
Nitrite, UA: NEGATIVE
Protein Ur, POC: NEGATIVE mg/dL
Specific Gravity, Urine: 1.02
Urobilinogen, Ur: NEGATIVE
pH, UA: 6 (ref 5.0–8.0)

## 2021-03-10 LAB — CBC WITH DIFFERENTIAL/PLATELET
Basophils Absolute: 0 10*3/uL (ref 0.0–0.2)
Basos: 0 %
EOS (ABSOLUTE): 0.1 10*3/uL (ref 0.0–0.4)
Eos: 1 %
Hematocrit: 41.7 % (ref 34.0–46.6)
Hemoglobin: 13.6 g/dL (ref 11.1–15.9)
Immature Grans (Abs): 0 10*3/uL (ref 0.0–0.1)
Immature Granulocytes: 0 %
Lymphocytes Absolute: 2.3 10*3/uL (ref 0.7–3.1)
Lymphs: 27 %
MCH: 25.9 pg — ABNORMAL LOW (ref 26.6–33.0)
MCHC: 32.6 g/dL (ref 31.5–35.7)
MCV: 79 fL (ref 79–97)
Monocytes Absolute: 0.4 10*3/uL (ref 0.1–0.9)
Monocytes: 5 %
Neutrophils Absolute: 5.6 10*3/uL (ref 1.4–7.0)
Neutrophils: 67 %
Platelets: 338 10*3/uL (ref 150–450)
RBC: 5.26 x10E6/uL (ref 3.77–5.28)
RDW: 13.4 % (ref 11.7–15.4)
WBC: 8.4 10*3/uL (ref 3.4–10.8)

## 2021-03-10 LAB — COMPREHENSIVE METABOLIC PANEL
ALT: 23 IU/L (ref 0–32)
AST: 21 IU/L (ref 0–40)
Albumin/Globulin Ratio: 1.6 (ref 1.2–2.2)
Albumin: 4.7 g/dL (ref 3.8–4.8)
Alkaline Phosphatase: 68 IU/L (ref 44–121)
BUN/Creatinine Ratio: 15 (ref 9–23)
BUN: 12 mg/dL (ref 6–20)
Bilirubin Total: 0.5 mg/dL (ref 0.0–1.2)
CO2: 18 mmol/L — ABNORMAL LOW (ref 20–29)
Calcium: 9.4 mg/dL (ref 8.7–10.2)
Chloride: 103 mmol/L (ref 96–106)
Creatinine, Ser: 0.78 mg/dL (ref 0.57–1.00)
Globulin, Total: 2.9 g/dL (ref 1.5–4.5)
Glucose: 96 mg/dL (ref 65–99)
Potassium: 4.5 mmol/L (ref 3.5–5.2)
Sodium: 139 mmol/L (ref 134–144)
Total Protein: 7.6 g/dL (ref 6.0–8.5)
eGFR: 103 mL/min/{1.73_m2} (ref >59)

## 2021-03-10 LAB — VITAMIN D 25 HYDROXY (VIT D DEFICIENCY, FRACTURES): Vit D, 25-Hydroxy: 26.6 ng/mL — ABNORMAL LOW (ref 30.0–100.0)

## 2021-03-14 ENCOUNTER — Other Ambulatory Visit: Payer: Self-pay | Admitting: Obstetrics and Gynecology

## 2021-03-14 ENCOUNTER — Other Ambulatory Visit (HOSPITAL_BASED_OUTPATIENT_CLINIC_OR_DEPARTMENT_OTHER): Payer: Self-pay

## 2021-03-14 DIAGNOSIS — R928 Other abnormal and inconclusive findings on diagnostic imaging of breast: Secondary | ICD-10-CM

## 2021-03-14 MED FILL — Norgestimate & Ethinyl Estradiol Tab 0.25 MG-35 MCG: ORAL | 84 days supply | Qty: 84 | Fill #1 | Status: AC

## 2021-03-14 MED FILL — Sertraline HCl Tab 25 MG: ORAL | 90 days supply | Qty: 90 | Fill #1 | Status: AC

## 2021-03-22 ENCOUNTER — Encounter: Payer: Self-pay | Admitting: *Deleted

## 2021-04-12 ENCOUNTER — Other Ambulatory Visit: Payer: Self-pay

## 2021-04-12 ENCOUNTER — Ambulatory Visit
Admission: RE | Admit: 2021-04-12 | Discharge: 2021-04-12 | Disposition: A | Discharge status: Home / Self Care | Payer: 59 | Source: Ambulatory Visit | Attending: Obstetrics and Gynecology | Admitting: Obstetrics and Gynecology

## 2021-04-12 DIAGNOSIS — Z803 Family history of malignant neoplasm of breast: Secondary | ICD-10-CM | POA: Diagnosis not present

## 2021-04-12 DIAGNOSIS — R928 Other abnormal and inconclusive findings on diagnostic imaging of breast: Secondary | ICD-10-CM

## 2021-04-12 DIAGNOSIS — D242 Benign neoplasm of left breast: Secondary | ICD-10-CM | POA: Diagnosis not present

## 2021-04-12 DIAGNOSIS — R922 Inconclusive mammogram: Secondary | ICD-10-CM | POA: Diagnosis not present

## 2021-05-05 DIAGNOSIS — Z23 Encounter for immunization: Secondary | ICD-10-CM | POA: Diagnosis not present

## 2021-05-15 ENCOUNTER — Encounter: Payer: Self-pay | Admitting: Family Medicine

## 2021-06-26 MED FILL — Sertraline HCl Tab 25 MG: ORAL | 90 days supply | Qty: 90 | Fill #2 | Status: AC

## 2021-06-26 MED FILL — Valacyclovir HCl Tab 1 GM: ORAL | 7 days supply | Qty: 30 | Fill #0 | Status: AC

## 2021-06-27 ENCOUNTER — Other Ambulatory Visit (HOSPITAL_BASED_OUTPATIENT_CLINIC_OR_DEPARTMENT_OTHER): Payer: Self-pay

## 2021-06-27 DIAGNOSIS — H5213 Myopia, bilateral: Secondary | ICD-10-CM | POA: Diagnosis not present

## 2021-08-23 ENCOUNTER — Ambulatory Visit: Payer: 59 | Admitting: Family Medicine

## 2021-09-21 ENCOUNTER — Ambulatory Visit: Payer: 59 | Admitting: Family Medicine

## 2021-10-18 ENCOUNTER — Encounter: Payer: Self-pay | Admitting: Family Medicine

## 2021-10-18 NOTE — Progress Notes (Signed)
Chief Complaint  ?Patient presents with  ? Follow-up  ?  Follow up on bp and weight. Patient states she has been eating less, working out but no weight loss-stayed the same. She is tearful.   ? ?Patient presents for follow up on elevated blood pressure and obesity. ? ?She was last seen at her physical in September. She had gained 30# from her physical the year prior. She had started exercising (going to [place] 3x/week x about 6 weeks at that time), plus exercising at home 2x/week (zumba, videos).  She had restarted [employer], and had lost 5#.  Her blood pressure had been very high, but was improved with exercise. ? ?She sent a message in 05/2021 stating that she had lost 10#, but got "stuck" ?Message stated:  ?"I'm doing all of the "right things" that I have done before (tracking what I eat via Weight Watchers, staying within the goals set there [I've also tried some variations in those goals - slightly up and down - to see if that made a difference], I'm working out at the gym and at home, watching sodium intake, etc. All of these have worked for me in the past, but it's just not working this time."  ?She had asked about a medication to help "jumpstart"--and was advised that phentermine would not be good, due to potentially increasing BP's, and suggested a visit to discuss other medications. ? ?Today she reports that [employer] wasn't helping.  Switched to tracking macros, calories, 2 different methods.  She is trying to stick with 1500-1600 cals/day, has been staying under and feels better. Wt fluctuating in the 240's. ?Diet worse the last few weeks since her grandmother's passing (people bringing food). ?She feels better overall, but weight isn't coming off. ?Clothes are fitting better, mostly the tops, not as much difference noted in the waist/pants. ? ?Hasn't been going to [place] as often (time issues). Instead she is doing videos at home 5 days/week (kept the carved out time for exercise)--cardio and  weights.   ?She is also using an under-desk treadmill while working, x 3 hours. ? ?Wt Readings from Last 3 Encounters:  ?10/19/21 250 lb 3.2 oz (113.5 kg)  ?03/09/21 250 lb 4.8 oz (113.5 kg)  ?03/12/20 220 lb (99.8 kg)  ? ?BP Readings from Last 3 Encounters:  ?10/19/21 (!) 150/80  ?03/09/21 130/88  ?03/12/20 (!) 157/92  ? ?Elevated BP's: ?She recently sent in a list of blood pressures, ranging from 135-155/79-101, mostly 140's-150's/90. ?She tries to limit the sodium in her diet. ?Only salts eggs, nothing else at home.  Eating out less often. ?Buys low sodium cans, reads labels. ?She denies headaches, chest pain, edema. ?  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  She continues to see a therapist.  Previously had tried to wean off, wasn't ready, had recurrent anxiety, irritability, so restarted it. She denied side effects. She did end up going off Sertraline in 08/2019.  She stopped it when she was sick with a stomach bug (and not keeping anything down), didn't resume it after better.  She reports that her moods have remained good.  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 ? ?History of vitamin D deficiency.  Level was 26.6 in 03/2021, when she hadn't been taking any supplement.  She was advised to take a daily D3 1000 IU. ?She bought it, took it for a  bit, but hasn't taken it in a while.  Still has it at home ? ? ?PMH, PSH, SH reviewed ? ?Outpatient Encounter Medications as of 10/19/2021  ?Medication Sig  ? hydrochlorothiazide (HYDRODIURIL) 25 MG tablet Start 0.5 tablet by mouth daily in the morning.  After 1-2 weeks increase to full tablet if BP>130/80  ? Semaglutide-Weight Management (WEGOVY) 0.25 MG/0.5ML SOAJ Inject 0.25 mg into the skin once a week.  ? Semaglutide-Weight Management (WEGOVY) 0.5 MG/0.5ML SOAJ Inject 0.5 mg into the skin once a week.  ? [DISCONTINUED] norgestimate-ethinyl estradiol  (ORTHO-CYCLEN) 0.25-35 MG-MCG tablet TAKE 1 TABLET BY MOUTH DAILY  ? [DISCONTINUED] sertraline (ZOLOFT) 25 MG tablet TAKE 1 TABLET BY MOUTH ONCE DAILY  ? ?No facility-administered encounter medications on file as of 10/19/2021.  ? ?NOT taking Wegovy or HCTZ prior to today's visit. ? ?Allergies  ?Allergen Reactions  ? Adhesive [Tape] Rash  ? ? ?ROS:  no fever, chills, headaches, dizziness, chest pain, shortness of breath, edema. ?No n/v/d.  Moods are good.  No URI symptoms.  Some mild sadness/grief related to passing of grandmother. ?See HPI ? ? ? ?PHYSICAL EXAM: ? ?BP (!) 150/80   Pulse 80   Ht _0  (1.727 m)   Wt 250 lb 3.2 oz (113.5 kg)   LMP 09/29/2021 (Exact Date)   Breastfeeding No   BMI 38.04 kg/m?  ? ?Wt Readings from Last 3 Encounters:  ?10/19/21 250 lb 3.2 oz (113.5 kg)  ?03/09/21 250 lb 4.8 oz (113.5 kg)  ?03/12/20 220 lb (99.8 kg)  ? ?130/90 on repeat by MD ? ?Pleasant, well-appearing female. Initially tearful, stressed/rushed (late for visit), but by the time of MD eval, she appeared relaxed, normal mood. ?HEENT: conjunctiva and sclera are clear, EOMI. ?She is alert and oriented. Cranial nerves grossly intact.  ?Normal mood, affect, hygiene and grooming ? ? ?ASSESSMENT/PLAN: ? ?Essential hypertension - start HCTZ 71m.  Can start at 1/2 tablet and increase to full in 7-10d if BP remains >130/80. K+ rich foods discussed. cont low Na, exercise, wt loss attempt - Plan: hydrochlorothiazide (HYDRODIURIL) 25 MG tablet ? ?Class 2 severe obesity due to excess calories with serious comorbidity and body mass index (BMI) of 38.0 to 38.9 in adult ([facility] - Counseled re: healthy diet, risks/SE of meds. Start Wegovy--sample given, rx for 0.540mto get PA. Also discussed Qsymia, prefer [id number] Plan: Semaglutide-Weight Management (WEGOVY) 0.25 MG/0.5ML SOAJ, Semaglutide-Weight Management (WEGOVY) 0.5 MG/0.5ML SOAJ ? ?F/u 4 weeks, nonfasting on BP, weight ?Will need b-met at visit ? ?I spent 40 minutes dedicated to  the care of this patient, including pre-visit review of records, face to face time, post-visit ordering of testing and documentation. ? ?

## 2021-10-19 ENCOUNTER — Ambulatory Visit: Payer: 59 | Admitting: Family Medicine

## 2021-10-19 ENCOUNTER — Other Ambulatory Visit (HOSPITAL_BASED_OUTPATIENT_CLINIC_OR_DEPARTMENT_OTHER): Payer: Self-pay

## 2021-10-19 ENCOUNTER — Telehealth: Payer: Self-pay | Admitting: Internal Medicine

## 2021-10-19 ENCOUNTER — Encounter: Payer: Self-pay | Admitting: Family Medicine

## 2021-10-19 VITALS — BP 130/90 | HR 80 | Ht 68.0 in | Wt 250.2 lb

## 2021-10-19 DIAGNOSIS — Z6838 Body mass index (BMI) 38.0-38.9, adult: Secondary | ICD-10-CM

## 2021-10-19 DIAGNOSIS — I10 Essential (primary) hypertension: Secondary | ICD-10-CM | POA: Diagnosis not present

## 2021-10-19 MED ORDER — HYDROCHLOROTHIAZIDE 25 MG PO TABS
ORAL_TABLET | ORAL | 1 refills | Status: DC
  Filled 2021-10-19: qty 30, 30d supply, fill #0

## 2021-10-19 MED ORDER — WEGOVY 0.5 MG/0.5ML ~~LOC~~ SOAJ
0.5000 mg | SUBCUTANEOUS | 0 refills | Status: DC
  Filled 2021-10-19 – 2021-11-16 (×4): qty 2, 28d supply, fill #0

## 2021-10-19 MED ORDER — WEGOVY 0.25 MG/0.5ML ~~LOC~~ SOAJ: 0.2500 mg | SUBCUTANEOUS | 0 refills | Status: DC

## 2021-10-19 NOTE — Patient Instructions (Addendum)
Restart vitamin D3 1000 IU daily. ?Start HCTZ 25 mg.Start at 1/2 tablet once daily, in the morning. After 7-10 days if your blood pressures remain >130/80, then increase to the full tablet. ?Be sure to have some potassium rich foods daily (ie 1/2 banana daily). ? ?Start [phone] if/when you are able to get it. It is once weekly. ?It is important to eat small meals. You will have more nausea with larger meals. ?Plan is to increase the dose every 4 weeks, if tolerated. ?

## 2021-10-19 NOTE — Telephone Encounter (Signed)
P.A. Mancel Parsons. Sent through cover my meds ?

## 2021-10-19 NOTE — Telephone Encounter (Signed)
Insurance has approved wegovy for each dose 0.'25mg'$ ,0.'5mg'$ ,'1mg'$ ,1.'7mg'$  2.'4mg'$  until 03/18/2022. This is approved for every 28 days titrating up on dose. ? ?I will notify pharmacy and patient ?

## 2021-10-20 DIAGNOSIS — I10 Essential (primary) hypertension: Secondary | ICD-10-CM | POA: Insufficient documentation

## 2021-11-14 ENCOUNTER — Other Ambulatory Visit (HOSPITAL_BASED_OUTPATIENT_CLINIC_OR_DEPARTMENT_OTHER): Payer: Self-pay

## 2021-11-15 ENCOUNTER — Encounter: Payer: Self-pay | Admitting: Family Medicine

## 2021-11-15 NOTE — Progress Notes (Signed)
Chief Complaint  ?Patient presents with  ? Follow-up  ?  Med check, 1 month follow up.   ? ?Patient presents for 1 month follow up on elevated blood pressure and obesity. ? ?HTN: she was started on HCTZ--started at 1/2 of the '25mg'$  tablet, and increased to the full pill.  Most recent BP's have been ranging 124/80 to 139/91 (see recent MyChart message with values). ?She denies side effects, muscle cramps. Hasn't noticed significant change in voiding frequency (has always gone frequently due to her water intake). 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  ?She denies headaches, chest pain, edema, dizziness. ?She tries to follow low sodium diet. ? ?Obesity: She was started on 0.'25mg'$  dose of wegovy at last visit.  She is tolerating the medication without side effects.  She got prior auth approved for titration. ?Felt bad just a few times--japanese meal with more rice than she usually has, and a few other times when having heavier carbs. Hasn't vomited.  ?She feels less hungry overall. ?She has noted constipation, started immediately with the [facility] (also when started the HCTZ and bananas). ?She started fiber gummy and higher fiber diet, which has helped. She is now moving her bowels every other day, no straining. ? ?She continues to do videos at home 5 days/week--cardio and weights and using an under-desk treadmill while working, x 3 hours. ? ?Other than eating smaller meals more frequently, she hasn't changed what she is eating, or exercise, but she is losing weight, the med is working, and she feels much better. ? ?She is also complaining of R ear pain since yesterday.  No allergies, popping, plugging or change in hearing. Feels like possible pimple starting. She is going out of town soon, to a water park, would like ear checked today. ? ? ?PMH, PSH, SH reviewed ? ?Outpatient Encounter Medications as of 11/16/2021  ?Medication Sig  ? FIBER SELECT  GUMMIES PO Take 2 each by mouth daily.  ? hydrochlorothiazide (HYDRODIURIL) 25 MG tablet Start 0.5 tablet by mouth daily in the morning.  After 1-2 weeks increase to full tablet if BP>130/80 (Patient taking differently: Take 25 mg by mouth daily.)  ? Semaglutide-Weight Management (WEGOVY) 0.25 MG/0.5ML SOAJ Inject 0.25 mg into the skin once a week.  ? Vitamin D, Cholecalciferol, 25 MCG (1000 UT) CAPS Take 1 capsule by mouth daily.  ? Semaglutide-Weight Management (WEGOVY) 0.5 MG/0.5ML SOAJ Inject 0.5 mg into the skin once a week. (Patient not taking: Reported on 11/16/2021)  ? ?No facility-administered encounter medications on file as of 11/16/2021.  ? ?Allergies  ?Allergen Reactions  ? Adhesive [Tape] Rash  ? ? ?ROS:  no fever, chills, headaches, dizziness, chest pain, shortness of breath, edema. ?No v/d.  Moods are good.  No URI/allergy symptoms.  Nausea only after a few large meals with more carbs. R ear pain per HPI.   ?See HPI ? ? ? ?PHYSICAL EXAM: ? ?BP 124/78   Pulse 76   Ht '5\' 8"'$  (1.727 m)   Wt 240 lb 6.4 oz (109 kg)   LMP 10/27/2021 (Exact Date)   BMI 36.55 kg/m?  ? ?Wt Readings from Last 3 Encounters:  ?11/16/21 240 lb 6.4 oz (109 kg)  ?10/19/21 250 lb 3.2 oz (113.5 kg)  ?03/09/21 250 lb 4.8 oz (113.5 kg)  ? ?Pleasant, well-appearing female, in good spirits ?HEENT: conjunctiva and sclera are clear, EOMI. L TM and EAC is normal. R EAC--there is  some exudate (white) in canal. There is some mild soft tissue swelling anteriorly where she notes discomfort, no pustule or erythema noted. OP is clear. Nose without drainage. TMJ nontender. ?Neuro: She is alert and oriented. Cranial nerves grossly intact.  ?Psych: Normal mood, affect, hygiene and grooming ?Heart: regular rate and rhythm no murmur ?Lungs: clear bilaterally ?Extremities: no edema ? ? ?ASSESSMENT/PLAN: ? ?Essential hypertension - well controlled on HCTZ '25mg'$  (lower in office than at home, consider bringing monitor to next visit for check). Cont med,  low Na diet, wt loss, exercise - Plan: Basic metabolic panel, hydrochlorothiazide (HYDRODIURIL) 25 MG tablet ? ?Class 2 severe obesity due to excess calories with serious comorbidity and body mass index (BMI) of 38.0 to 38.9 in adult ([place]) - 10# wt loss, BMI now 36, on 4 wks of 0.'25mg'$  dose of Wegovy. Tolerating well.  Increase to 0.'5mg'$  dose; 4wk f/u by MyChart for titration, in office 2 mos ? ?Acute swimmer's ear of right side - Plan: neomycin-polymyxin b-dexamethasone (MAXITROL) 3.5-10000-0.1 SUSP ? ?Medication monitoring encounter - Plan: Basic metabolic panel ? ?Increase Wegovy to 0.'5mg'$  (has rx at pharm) ?4 weeks increase to '1mg'$  if tolerating, pt to contact us via MyChart (if any SE, with home weight). ?F/u in office in 2 mos. ?Discussed BPs/symptoms for which she should cut her HCTZ dose in half.   ?Need to verify accuracy of her monitor--can do a NV, vs bring to next appt. ?BP normal in office today. ?

## 2021-11-16 ENCOUNTER — Other Ambulatory Visit (HOSPITAL_BASED_OUTPATIENT_CLINIC_OR_DEPARTMENT_OTHER): Payer: Self-pay

## 2021-11-16 ENCOUNTER — Encounter: Payer: Self-pay | Admitting: Family Medicine

## 2021-11-16 ENCOUNTER — Ambulatory Visit: Payer: 59 | Admitting: Family Medicine

## 2021-11-16 VITALS — BP 124/78 | HR 76 | Ht 68.0 in | Wt 240.4 lb

## 2021-11-16 DIAGNOSIS — I10 Essential (primary) hypertension: Secondary | ICD-10-CM | POA: Diagnosis not present

## 2021-11-16 DIAGNOSIS — Z6838 Body mass index (BMI) 38.0-38.9, adult: Secondary | ICD-10-CM

## 2021-11-16 DIAGNOSIS — H60331 Swimmer's ear, right ear: Secondary | ICD-10-CM | POA: Diagnosis not present

## 2021-11-16 DIAGNOSIS — Z5181 Encounter for therapeutic drug level monitoring: Secondary | ICD-10-CM

## 2021-11-16 MED ORDER — HYDROCHLOROTHIAZIDE 25 MG PO TABS
25.0000 mg | ORAL_TABLET | Freq: Every day | ORAL | 0 refills | Status: DC
  Filled 2021-11-16: qty 90, 90d supply, fill #0

## 2021-11-16 MED ORDER — NEOMYCIN-POLYMYXIN-DEXAMETH 3.5-10000-0.1 OP SUSP
4.0000 [drp] | Freq: Four times a day (QID) | OPHTHALMIC | 0 refills | Status: DC
  Filled 2021-11-16: qty 5, 7d supply, fill #0
  Filled 2021-11-21: qty 5, 7d supply, fill #1

## 2021-11-16 NOTE — Patient Instructions (Signed)
Send a MyChart message in 4 weeks to let us know how you're doing with the 0.'5mg'$  Wegovy dose.  If having nausea, but tolerable, and still good weight loss, we do not need to further titrate up. ?If no side effects, and weight loss has slowed down, we can further titrate the dose up for the next refill. ? ?Continue to monitor blood pressure. ?If you notice any frequent dizziness, or BP is consistently <625/63-89 systolic, then we can likely cut the dose back to 1/2 tablet.  Feel free to send me a photo of your blood pressure log. ? ? ?

## 2021-11-17 ENCOUNTER — Telehealth: Payer: 59 | Admitting: Family Medicine

## 2021-11-17 DIAGNOSIS — H9201 Otalgia, right ear: Secondary | ICD-10-CM

## 2021-11-17 LAB — BASIC METABOLIC PANEL
BUN/Creatinine Ratio: 15 (ref 9–23)
BUN: 11 mg/dL (ref 6–20)
CO2: 21 mmol/L (ref 20–29)
Calcium: 9.8 mg/dL (ref 8.7–10.2)
Chloride: 100 mmol/L (ref 96–106)
Creatinine, Ser: 0.74 mg/dL (ref 0.57–1.00)
Glucose: 90 mg/dL (ref 70–99)
Potassium: 4.1 mmol/L (ref 3.5–5.2)
Sodium: 137 mmol/L (ref 134–144)
eGFR: 109 mL/min/{1.73_m2} (ref >59)

## 2021-11-17 NOTE — Progress Notes (Signed)
Based on what you shared with me, I feel your condition warrants further evaluation and I recommend that you be seen in a face to face visit. ?  ?NOTE: There will be NO CHARGE for this eVisit ?  ?If you are having a true medical emergency please call 911.   ?  ? For an urgent face to face visit, [place] has six urgent care centers for your convenience:  ?  ? [facility] at [facility] ?Get Driving Directions ?[phone] ?[address] ?[place], [place] [address] ?  ? [facility]) ?Get Driving Directions ?[phone] ?[address] ?[place], [place] [address] ? ?[facility] [place] ([place]) ?Get Driving Directions ?[address] [place],  [place]  [address] ? ?[facility] at [facility] ?Get Driving Directions ?[phone] ?[address], [address] ?[place], [place] [address] ?  ?[facility] [place] ?Get Driving Directions  ?[phone] ?[address] ?[address] ?[place], [place] [address] ?  ?[facility] at [facility] ?Get Driving Directions ?[phone] ?[address] ?[place],  [address] ? ?Your MyChart E-visit questionnaire answers were reviewed by a board certified advanced clinical practitioner to complete your personal care plan based on your specific symptoms.  Thank you for using e-Visits. ?   ?

## 2021-11-18 ENCOUNTER — Ambulatory Visit
Admission: RE | Admit: 2021-11-18 | Discharge: 2021-11-18 | Disposition: A | Discharge status: Home / Self Care | Payer: 59 | Source: Ambulatory Visit | Attending: Family Medicine | Admitting: Family Medicine

## 2021-11-18 VITALS — BP 140/92 | HR 104 | Temp 99.6°F | Resp 16

## 2021-11-18 DIAGNOSIS — H60501 Unspecified acute noninfective otitis externa, right ear: Secondary | ICD-10-CM

## 2021-11-18 DIAGNOSIS — I10 Essential (primary) hypertension: Secondary | ICD-10-CM | POA: Diagnosis not present

## 2021-11-18 HISTORY — DX: Essential (primary) hypertension: I10

## 2021-11-18 NOTE — ED Triage Notes (Signed)
Patient c/o RT ear pain x 3 days.  ? ?Patient endorses tragus tenderness.  ? ?Patient endorses worsening ear fullness. Patient endorses difficulty hearing out of RT ear.  ? ?Patient endorses ear fullness.  ? ?Patient was prescribed Maxitrol drops with no relief of symptoms.  ?

## 2021-11-18 NOTE — ED Provider Notes (Signed)
?[facility] ? ? ? ?CSN: [id number] ?Arrival date & time: 11/18/21  1356 ? ? ?  ? ?History   ?Chief Complaint ?Chief Complaint  ?Patient presents with  ? Ear Fullness  ? APPT 1415  ? ? ?HPI ?Tamara Spencer is a 31 y.o. female.  She presents with right ear pain x3 days.  The pain has gotten worse.  No ear drainage, fever, sore throat, cough, shortness of breath, or other symptoms.  Treatment at home with ibuprofen.  Patient was seen by her PCP on 11/16/2021; diagnosed with swimmer's ear; treated with Maxitrol ear drops.  She had an E-visit yesterday and was instructed to be seen in person.  Her medical history includes hypertension, ED, migraine headache. ? ? ?The history is provided by the patient and medical records.  ? ?Past Medical History:  ?Diagnosis Date  ? Headache   ? monthly; less severe since on new OCP  ? Hypertension   ? Recurrent cold sores   ? ? ?Patient Active Problem List  ? Diagnosis Date Noted  ? Essential hypertension 10/20/2021  ? Class 2 severe obesity due to excess calories with serious comorbidity and body mass index (BMI) of 38.0 to 38.9 in adult [employer]) 10/20/2021  ? Indication for care in labor and delivery, antepartum 05/23/2018  ? 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 NSVD (normal spontaneous vaginal delivery) 08/07/2015  ? Indication for care in labor or delivery 08/06/2015  ? Genetic testing 06/14/2014  ? Keloid of skin 05/10/2014  ? Family history of breast cancer in mother 03/11/2014  ? Elevated blood pressure 03/11/2014  ? Herpes labialis 11/12/2012  ? Migraine headache 10/25/2011  ? ? ?Past Surgical History:  ?Procedure Laterality Date  ? KELOID EXCISION    ? WISDOM TOOTH EXTRACTION    ? ? ?OB History   ? ? Gravida  ?2  ? Para  ?2  ? Term  ?2  ? Preterm  ?   ? AB  ?   ? Living  ?2  ?  ? ? SAB  ?   ? IAB  ?   ? Ectopic  ?   ? Multiple  ?0  ? Live Births  ?2  ?   ?  ?  ? ? ? ?Home Medications   ? ?Prior to Admission medications   ?Medication Sig Start Date End Date Taking? Authorizing Provider   ?FIBER SELECT GUMMIES PO Take 2 each by mouth daily.   Yes [provider]  ?hydrochlorothiazide (HYDRODIURIL) 25 MG tablet Take 1 tablet (25 mg total) by mouth daily. 11/16/21  Yes Rita Ohara, MD  ?neomycin-polymyxin b-dexamethasone (MAXITROL) 3.5-10000-0.1 SUSP Place 4 drops into the right ear 4 (four) times daily. 11/16/21  Yes Rita Ohara, MD  ?Semaglutide-Weight Management [facility]) 0.5 MG/0.5ML SOAJ Inject 0.5 mg into the skin once a week. 10/19/21  Yes Rita Ohara, MD  ?Vitamin D, Cholecalciferol, 25 MCG (1000 UT) CAPS Take 1 capsule by mouth daily.   Yes [provider]  ?Semaglutide-Weight Management (WEGOVY) 0.25 MG/0.5ML SOAJ Inject 0.25 mg into the skin once a week. 10/19/21   Rita Ohara, MD  ? ? ?Family History ?Family History  ?Problem Relation Age of Onset  ? Other Mother   ?     Myleofibrosis  ? Breast cancer Mother 82  ? Diabetes Father   ? Hypertension Father   ? Hyperlipidemia Father   ? Heart disease Father 28  ? Kidney disease Father   ? Liver cancer Maternal Grandmother   ?  Diabetes Paternal Grandfather   ? Leukemia Cousin 23  ?     mother's maternal first cousin  ? Breast cancer Cousin   ?     mother's materanl first cousin dx in her 58s  ? Breast cancer Other   ?     dx in her 26s  ? ? ?Social History ?Social History  ? ?Tobacco Use  ? Smoking status: Never  ? Smokeless tobacco: Never  ?Vaping Use  ? Vaping Use: Never used  ?Substance Use Topics  ? Alcohol use: No  ? Drug use: No  ? ? ? ?Allergies   ?Adhesive [tape] ? ? ?Review of Systems ?Review of Systems  ?Constitutional:  Negative for chills and fever.  ?HENT:  Positive for ear pain. Negative for ear discharge and sore throat.   ?Respiratory:  Negative for cough and shortness of breath.   ?Gastrointestinal:  Negative for diarrhea and vomiting.  ?Skin:  Negative for color change and rash.  ?All other systems reviewed and are negative. ? ? ?Physical Exam ?Triage Vital Signs ?ED Triage Vitals [11/18/21 1416]  ?Enc Vitals Group  ?    BP (!) 157/120  ?   Pulse Rate (!) 104  ?   Resp 16  ?   Temp 99.6 ?F (37.6 ?C)  ?   Temp Source Oral  ?   SpO2 100 %  ?   Weight   ?   Height   ?   Head Circumference   ?   Peak Flow   ?   Pain Score   ?   Pain Loc   ?   Pain Edu?   ?   Excl. in [place]?   ? ?No data found. ? ?Updated Vital Signs ?BP (!) 140/92 (BP Location: Left Arm)   Pulse (!) 104   Temp 99.6 ?F (37.6 ?C) (Oral)   Resp 16   LMP 10/27/2021 (Exact Date)   SpO2 100%  ? ?Visual Acuity ?Right Eye Distance:   ?Left Eye Distance:   ?Bilateral Distance:   ? ?Right Eye Near:   ?Left Eye Near:    ?Bilateral Near:    ? ?Physical Exam ?Vitals and nursing note reviewed.  ?Constitutional:   ?   General: She is not in acute distress. ?   Appearance: She is well-developed. She is obese. She is not ill-appearing.  ?HENT:  ?   Right Ear: Tympanic membrane normal. Drainage present.  ?   Left Ear: Tympanic membrane and ear canal normal.  ?   Ears:  ?   Comments: Right ear canal has small amount of purulent drainage.  TM clear. ?   Nose: Nose normal.  ?   Mouth/Throat:  ?   Mouth: Mucous membranes are moist.  ?   Pharynx: Oropharynx is clear.  ?Cardiovascular:  ?   Rate and Rhythm: Normal rate and regular rhythm.  ?   Heart sounds: Normal heart sounds.  ?Pulmonary:  ?   Effort: Pulmonary effort is normal. No respiratory distress.  ?   Breath sounds: Normal breath sounds.  ?Musculoskeletal:  ?   Cervical back: Neck supple.  ?Skin: ?   General: Skin is warm and dry.  ?Neurological:  ?   General: No focal deficit present.  ?   Mental Status: She is alert and oriented to person, place, and time.  ?   Gait: Gait normal.  ?Psychiatric:     ?   Mood and Affect: Mood normal.     ?  Behavior: Behavior normal.  ? ? ? ?UC Treatments / Results  ?Labs ?(all labs ordered are listed, but only abnormal results are displayed) ?Labs Reviewed - No data to display ? ?EKG ? ? ?Radiology ?No results found. ? ?Procedures ?Procedures (including critical care time) ? ?Medications Ordered  in UC ?Medications - No data to display ? ?Initial Impression / Assessment and Plan / UC Course  ?I have reviewed the triage vital signs and the nursing notes. ? ?Pertinent labs & imaging results that were available during my care of the patient were reviewed by me and considered in my medical decision making (see chart for details). ? ?Right otitis externa.  Elevated blood pressure reading with hypertension.  TMs are clear.  Instructed patient to continue using antibiotic eardrops as directed.  Education provided on otitis externa.  Also discussed with patient that her blood pressure is elevated today and needs to be rechecked by PCP on Monday.  Education provided on managing hypertension.  She agrees to plan of care. ? ? ?Final Clinical Impressions(s) / UC Diagnoses  ? ?Final diagnoses:  ?Acute otitis externa of right ear, unspecified type  ?Elevated blood pressure reading in office with diagnosis of hypertension  ? ? ? ?Discharge Instructions   ? ?  ?Continue using the eardrops as directed. ? ?Your blood pressure is elevated today at 157/120; repeat 149/109; repeat 140/92.  Please have this rechecked by your primary care provider on Monday.     ? ? ? ? ? ?ED Prescriptions   ?None ?  ? ?PDMP not reviewed this encounter. ?  ?Sharion Balloon, NP ?11/18/21 1503 ? ?

## 2021-11-18 NOTE — Discharge Instructions (Addendum)
Continue using the eardrops as directed. ? ?Your blood pressure is elevated today at 157/120; repeat 149/109; repeat 140/92.  Please have this rechecked by your primary care provider on Monday.     ? ?

## 2021-11-21 ENCOUNTER — Other Ambulatory Visit (HOSPITAL_BASED_OUTPATIENT_CLINIC_OR_DEPARTMENT_OTHER): Payer: Self-pay

## 2021-12-14 ENCOUNTER — Encounter: Payer: Self-pay | Admitting: Family Medicine

## 2021-12-17 MED ORDER — WEGOVY 1 MG/0.5ML ~~LOC~~ SOAJ
1.0000 mg | SUBCUTANEOUS | 0 refills | Status: DC
Start: 2021-12-17 — End: 2022-01-08
  Filled 2021-12-17: qty 2, 28d supply, fill #0

## 2021-12-18 ENCOUNTER — Encounter (HOSPITAL_BASED_OUTPATIENT_CLINIC_OR_DEPARTMENT_OTHER): Payer: Self-pay

## 2021-12-18 ENCOUNTER — Other Ambulatory Visit (HOSPITAL_BASED_OUTPATIENT_CLINIC_OR_DEPARTMENT_OTHER): Payer: Self-pay

## 2021-12-19 ENCOUNTER — Other Ambulatory Visit (HOSPITAL_COMMUNITY): Payer: Self-pay

## 2022-01-07 ENCOUNTER — Encounter: Payer: Self-pay | Admitting: Family Medicine

## 2022-01-07 NOTE — Progress Notes (Signed)
Start time: 9:45 End time: 10:01  Virtual Visit via Video Note  I connected with Moshe Cipro on 01/08/22 by a video enabled telemedicine application and verified that I am speaking with the correct person using two identifiers.  Location: Patient: at grandfather's house, in a room alone (he passed away--visit was changed to virtual this morning due to meeting with funeral home today, and needing the travel time) Provider: office   I discussed the limitations of evaluation and management by telemedicine and the availability of in person appointments. The patient expressed understanding and agreed to proceed.  History of Present Illness:  Chief Complaint  Patient presents with   Follow-up    VIRTUAL 2 month follow up/med check. No new concerns.    Patient presents for follow-up on blood pressure and obesity.  HTN: She is compliant with HCTZ '25mg'$  daily.  She denies side effects, muscle cramps.  She denies headaches, chest pain, edema, dizziness.  She tries to follow low sodium diet. BP's are running 123-136/75-86.   Obesity: Wegovy dose was titrated up to '1mg'$  about 3 weeks ago. She had some nausea and such decreased appetite to the point where she didn't want to eat anything.  This lasted 3d with the first 2 doses of '1mg'$ , only lasted 1 day with the last dose (given 3 days ago). She feels better now, still notes decreased appetite.  She is eating less overall.  She continues to track her calories--to ensure that she gets a minimum of 1200, and no more than 1600 kcal/d.  She finds the tracking helps her.  She also checks weekly weights. Fiber gummies and high fiber diet has controlled her bowels, no longer having constipation (had initially from the [facility]). She has hit her deductible, should now be $25/month for the [facility].  She continues to do videos at home 5 days/week (cardio and weights) and using an under-desk treadmill while working from home. This is the routine when she can't  get to the gym. If she goes to the gym, she does the weights there (still gets cardio at home).     PMH, PSH, SH reviewed  Outpatient Encounter Medications as of 01/08/2022  Medication Sig   FIBER SELECT GUMMIES PO Take 2 each by mouth daily.   hydrochlorothiazide (HYDRODIURIL) 25 MG tablet Take 1 tablet (25 mg total) by mouth daily.   Semaglutide-Weight Management (WEGOVY) 1.7 MG/0.75ML SOAJ Inject 1.7 mg into the skin once a week.   Vitamin D, Cholecalciferol, 25 MCG (1000 UT) CAPS Take 1 capsule by mouth daily.   [DISCONTINUED] Semaglutide-Weight Management (WEGOVY) 1 MG/0.5ML SOAJ Inject 1 mg into the skin once a week.   [DISCONTINUED] neomycin-polymyxin b-dexamethasone (MAXITROL) 3.5-10000-0.1 SUSP Place 4 drops into the right ear 4 (four) times daily.   No facility-administered encounter medications on file as of 01/08/2022.   Taking '1mg'$  of Wegovy prior to today's visit (not 1.7)  Allergies  Allergen Reactions   Adhesive [Tape] Rash    ROS: no f/c, URI symptoms, headaches, dizziness, muscle cramps, edema, shortness of breath, chest pain, palpitations. Appetite remains down from normal, no further nausea.  Constipation has resolved. See HPI.     Observations/Objective:  BP 131/82   Ht '5\' 8"'$  (1.727 m)   Wt 230 lb 3.2 oz (104.4 kg)   LMP 12/23/2021 (Exact Date)   BMI 35.00 kg/m   Wt Readings from Last 3 Encounters:  01/08/22 230 lb 3.2 oz (104.4 kg)  11/16/21 240 lb 6.4 oz (109 kg)  10/19/21 250 lb 3.2 oz (113.5 kg)   Well-appearing, pleasant female, in no distress She is alert and oriented.  Cranial nerves are grossly intact. Conjunctiva and sclera are clear. Normal speech. Normal mood, affect, grooming Exam is limited due to virtual nature of the visit.   Assessment and Plan:  Essential hypertension - well controlled; cont HCTZ '25mg'$ .  Reviewed BP ranges, and only if consistently <110/60-70 would I consider lowering dose  Obesity, Class II, BMI 35-39.9 - Very  good response to [facility], having lost 20# since starting med in April. Continue titration to 1.'7mg'$  weekly.  - Plan: Semaglutide-Weight Management (WEGOVY) 1.7 MG/0.75ML SOAJ  Pt to contact us in 4 weeks with how she is doing-- Can further titrate up to 2.'4mg'$  dose if tolerating. She will let us know if BP's drop too low or any dizziness, and we can titrate down HCTZ.  Doing well on '25mg'$  dose currently, cont.  Briefly discussed the loss of her grandfather, and the availability of grief counseling for her or her family.  Follow Up Instructions:    I discussed the assessment and treatment plan with the patient. The patient was provided an opportunity to ask questions and all were answered. The patient agreed with the plan and demonstrated an understanding of the instructions.   The patient was advised to call back or seek an in-person evaluation if the symptoms worsen or if the condition fails to improve as anticipated.  I spent 20 minutes dedicated to the care of this patient, including pre-visit review of records, face to face time, post-visit ordering of testing and documentation.    Vikki Ports, MD

## 2022-01-08 ENCOUNTER — Encounter: Payer: Self-pay | Admitting: Family Medicine

## 2022-01-08 ENCOUNTER — Other Ambulatory Visit (HOSPITAL_BASED_OUTPATIENT_CLINIC_OR_DEPARTMENT_OTHER): Payer: Self-pay

## 2022-01-08 ENCOUNTER — Telehealth (INDEPENDENT_AMBULATORY_CARE_PROVIDER_SITE_OTHER): Payer: 59 | Admitting: Family Medicine

## 2022-01-08 VITALS — BP 131/82 | Ht 68.0 in | Wt 230.2 lb

## 2022-01-08 DIAGNOSIS — I10 Essential (primary) hypertension: Secondary | ICD-10-CM

## 2022-01-08 DIAGNOSIS — E669 Obesity, unspecified: Secondary | ICD-10-CM

## 2022-01-08 MED ORDER — WEGOVY 1.7 MG/0.75ML ~~LOC~~ SOAJ
1.7000 mg | SUBCUTANEOUS | 0 refills | Status: DC
  Filled 2022-01-08: qty 3, 28d supply, fill #0

## 2022-01-08 NOTE — Patient Instructions (Signed)
Complete the 4th week of '1mg'$ , then increase to the 1.'7mg'$  dose of Wegovy. Contact us in 4 weeks (after 3 doses), to give [place] an update (weight, blood pressure, any side effects).  If doing well, we will send in the 2.'4mg'$  dose.  Given the issues with supply chains, I like sending in the refill a week early to give the pharmacy time to get it, rather than delay your next injection.  We should see you again in 2-3 months (sooner if any problems).  Keep up the great work!

## 2022-01-22 ENCOUNTER — Encounter: Payer: 59 | Admitting: Family Medicine

## 2022-01-25 ENCOUNTER — Other Ambulatory Visit (HOSPITAL_BASED_OUTPATIENT_CLINIC_OR_DEPARTMENT_OTHER): Payer: Self-pay

## 2022-01-25 ENCOUNTER — Encounter: Payer: Self-pay | Admitting: Family Medicine

## 2022-01-25 MED ORDER — WEGOVY 1 MG/0.5ML ~~LOC~~ SOAJ
1.0000 mg | SUBCUTANEOUS | 0 refills | Status: DC
Start: 2022-01-25 — End: 2022-04-09
  Filled 2022-01-25: qty 2, 28d supply, fill #0

## 2022-02-22 ENCOUNTER — Other Ambulatory Visit: Payer: Self-pay | Admitting: Family Medicine

## 2022-02-22 ENCOUNTER — Other Ambulatory Visit (HOSPITAL_BASED_OUTPATIENT_CLINIC_OR_DEPARTMENT_OTHER): Payer: Self-pay

## 2022-02-22 ENCOUNTER — Encounter: Payer: Self-pay | Admitting: Family Medicine

## 2022-02-22 DIAGNOSIS — E669 Obesity, unspecified: Secondary | ICD-10-CM

## 2022-02-22 MED ORDER — WEGOVY 1.7 MG/0.75ML ~~LOC~~ SOAJ
1.7000 mg | SUBCUTANEOUS | 0 refills | Status: DC
  Filled 2022-03-06: qty 3, 28d supply, fill #0
  Filled ????-??-??: fill #0

## 2022-03-06 ENCOUNTER — Other Ambulatory Visit (HOSPITAL_BASED_OUTPATIENT_CLINIC_OR_DEPARTMENT_OTHER): Payer: Self-pay

## 2022-03-14 ENCOUNTER — Encounter: Payer: Self-pay | Admitting: Internal Medicine

## 2022-03-28 ENCOUNTER — Encounter: Payer: 59 | Admitting: Family Medicine

## 2022-04-08 NOTE — Progress Notes (Unsigned)
No chief complaint on file.   Patient presents for follow-up on obesity/weight loss treatment.  She had significant side effects of the [facility] when the dose was titrated up from '1mg'$  to 1.'7mg'$  in July. She reported n/v/d, stomach cramping.  Tolerating only toast and gatorade, even water made her feel bad.  She decreased back to the '1mg'$  for longer, eventually was able to take the 1.'7mg'$ .  Note from patient in August reported that she was taking the 1.'7mg'$  dose, had some SE but was figuring out triggers and ways to manage. She is still currently taking 1.'7mg'$  dose.  She is still tracking her calories (minimum 1200, max 1600), weekly weights. Fiber gummies and high fiber diet has helped keep her bowels regular.  Prior exercise UPDATE: She continues to do videos at home 5 days/week (cardio and weights) and using an under-desk treadmill while working from home. This is the routine when she can't get to the gym. If she goes to the gym, she does the weights there (still gets cardio at home).    HTN: She is compliant with HCTZ '25mg'$  daily.  She denies side effects, muscle cramps.  She denies headaches, chest pain, edema, dizziness.  She tries to follow low sodium diet. BP's are running 123-136/75-86.     PMH, PSH, SH reviewed   ROS:  PHYSICAL EXAM:  There were no vitals taken for this visit.  Wt Readings from Last 3 Encounters:  01/08/22 230 lb 3.2 oz (104.4 kg)  11/16/21 240 lb 6.4 oz (109 kg)  10/19/21 250 lb 3.2 oz (113.5 kg)    ASSESSMENT/PLAN:   Flu shot, or to get through work? Remind to let us know when she gets. Has she had any COVID boosters? Recommended booster   HCTZ--per comp, should have run out in August. Taking? Need RF on it??

## 2022-04-09 ENCOUNTER — Other Ambulatory Visit (HOSPITAL_BASED_OUTPATIENT_CLINIC_OR_DEPARTMENT_OTHER): Payer: Self-pay

## 2022-04-09 ENCOUNTER — Ambulatory Visit: Payer: 59 | Admitting: Family Medicine

## 2022-04-09 ENCOUNTER — Encounter: Payer: Self-pay | Admitting: Family Medicine

## 2022-04-09 VITALS — BP 124/88 | HR 84 | Ht 68.0 in | Wt 214.6 lb

## 2022-04-09 DIAGNOSIS — Z6832 Body mass index (BMI) 32.0-32.9, adult: Secondary | ICD-10-CM | POA: Diagnosis not present

## 2022-04-09 DIAGNOSIS — I10 Essential (primary) hypertension: Secondary | ICD-10-CM | POA: Diagnosis not present

## 2022-04-09 DIAGNOSIS — Z23 Encounter for immunization: Secondary | ICD-10-CM | POA: Diagnosis not present

## 2022-04-09 DIAGNOSIS — E6609 Other obesity due to excess calories: Secondary | ICD-10-CM

## 2022-04-09 DIAGNOSIS — E669 Obesity, unspecified: Secondary | ICD-10-CM

## 2022-04-09 MED ORDER — WEGOVY 1.7 MG/0.75ML ~~LOC~~ SOAJ
1.7000 mg | SUBCUTANEOUS | 2 refills | Status: DC
  Filled 2022-04-09: qty 3, 28d supply, fill #0
  Filled 2022-05-04 – 2022-05-11 (×3): qty 3, 28d supply, fill #1
  Filled 2022-06-08: qty 3, 28d supply, fill #2

## 2022-04-09 MED ORDER — HYDROCHLOROTHIAZIDE 25 MG PO TABS
ORAL_TABLET | ORAL | 0 refills | Status: DC
  Filled 2022-04-09: qty 90, 90d supply, fill #0

## 2022-04-09 NOTE — Patient Instructions (Addendum)
I recommend getting the updated COVID booster (you should wait 2 weeks from today's flu shot to get this; unfortunately, we didn't have it yet at the office).  Your monitor is not accurate (though readings at home were much more equivalent to what we got in the office; your monitor read very high in the office today).  Omron is a good brand.  (Reli-On, a [employer] brand, is also ok).  Given your blood pressure today, and the fact that you're continuing to lose weight, I think it is fine to stay at the 1/2 tablet of HCTZ.  Set up a nurse visit to have your new monitor checked to see if it is accurate. If BP's at home are consistently running 135/85 or higher, then increase back to the full pill.

## 2022-04-11 ENCOUNTER — Other Ambulatory Visit (HOSPITAL_BASED_OUTPATIENT_CLINIC_OR_DEPARTMENT_OTHER): Payer: Self-pay

## 2022-04-13 ENCOUNTER — Other Ambulatory Visit (HOSPITAL_BASED_OUTPATIENT_CLINIC_OR_DEPARTMENT_OTHER): Payer: Self-pay

## 2022-04-16 ENCOUNTER — Other Ambulatory Visit (HOSPITAL_BASED_OUTPATIENT_CLINIC_OR_DEPARTMENT_OTHER): Payer: Self-pay

## 2022-04-17 ENCOUNTER — Other Ambulatory Visit (HOSPITAL_BASED_OUTPATIENT_CLINIC_OR_DEPARTMENT_OTHER): Payer: Self-pay

## 2022-04-17 ENCOUNTER — Encounter: Payer: Self-pay | Admitting: Internal Medicine

## 2022-04-18 ENCOUNTER — Other Ambulatory Visit (HOSPITAL_BASED_OUTPATIENT_CLINIC_OR_DEPARTMENT_OTHER): Payer: Self-pay

## 2022-04-18 ENCOUNTER — Telehealth: Payer: Self-pay | Admitting: Family Medicine

## 2022-04-19 ENCOUNTER — Other Ambulatory Visit (HOSPITAL_BASED_OUTPATIENT_CLINIC_OR_DEPARTMENT_OTHER): Payer: Self-pay

## 2022-04-27 NOTE — Telephone Encounter (Signed)
P.A. approved til 04/19/23, sent mychart message

## 2022-05-04 ENCOUNTER — Encounter (HOSPITAL_BASED_OUTPATIENT_CLINIC_OR_DEPARTMENT_OTHER): Payer: Self-pay | Admitting: Pharmacist

## 2022-05-04 ENCOUNTER — Other Ambulatory Visit (HOSPITAL_BASED_OUTPATIENT_CLINIC_OR_DEPARTMENT_OTHER): Payer: Self-pay

## 2022-05-11 ENCOUNTER — Other Ambulatory Visit (HOSPITAL_BASED_OUTPATIENT_CLINIC_OR_DEPARTMENT_OTHER): Payer: Self-pay

## 2022-06-09 ENCOUNTER — Other Ambulatory Visit (HOSPITAL_BASED_OUTPATIENT_CLINIC_OR_DEPARTMENT_OTHER): Payer: Self-pay

## 2022-06-10 ENCOUNTER — Other Ambulatory Visit (HOSPITAL_BASED_OUTPATIENT_CLINIC_OR_DEPARTMENT_OTHER): Payer: Self-pay

## 2022-06-10 ENCOUNTER — Encounter: Payer: Self-pay | Admitting: Family Medicine

## 2022-06-10 MED ORDER — WEGOVY 2.4 MG/0.75ML ~~LOC~~ SOAJ
2.4000 mg | SUBCUTANEOUS | 0 refills | Status: DC
  Filled 2022-06-10: qty 3, 28d supply, fill #0

## 2022-06-11 ENCOUNTER — Other Ambulatory Visit (HOSPITAL_BASED_OUTPATIENT_CLINIC_OR_DEPARTMENT_OTHER): Payer: Self-pay

## 2022-06-26 ENCOUNTER — Encounter: Payer: Self-pay | Admitting: Family Medicine

## 2022-06-26 NOTE — Progress Notes (Unsigned)
No chief complaint on file.  Patient presents for follow-up on obesity/weight loss treatment and hypertension.   She had significant side effects of the [facility] when the dose was titrated up from 53m to 1.738min July. She reported n/v/d, stomach cramping.  She decreased back to the 31m13mor longer, eventually was able to take the 1.7mg50mAt last visit (October) she noted that side effects seemed to be worse when she was on her menstrual cycle.  Still had some diarrhea, no longer has any n/v. She contacted us [employer] this month asking to increase Wegovy dose to 2.4mg,62m the pharmacy didn't have any of the 1.7 available.  UPDATE  Last time she reported that if she goes too long without eating, she feels nauseated.  Has 5-6 cheez-its which makes her feel better.  This usually occurs in the morning if she ate early the night before, and doesn't eat anything until 10am or later. She continues to eat greek yogurt, cottage cheese mixed in with other foods (adding for extra protein).   She is still tracking her calories (minimum 1200, max 1600), now getting more calories from protein, weekly weights are monitored.   Exercise: She is still using an under-desk treadmill while working from home (4 days/week). Goes to [employer]/week for an hour   HTN: She cut back on HCTZ from 25mg 69m2.5 mg in September. She did this because she had some dizziness with standing, and she felt better after lowering the dose. At her last visit her home BP's were reported as much lower than the reading we got in the office. She sent in list of BP's yesterday.  Record indicated that she increased back to the 25mg d43mof HCTZ 11/25.  BP's at home ranged from 101-126/80-93. Mostly 115/85 998/72olic is higher than prior to last visit), with diastolic >90 whe>15he noted stressful day. She brought in her monitor to verify the accuracy today.  BP Readings from Last 3 Encounters:  04/09/22 124/88  01/08/22 131/82  11/18/21 (!)  140/92   She drinks three 32 oz of water daily. She denies side effects, muscle cramps.  She denies headaches, chest pain, edema.  She tries to follow low sodium diet.   PMH, PSH, SH reviewed   ROS: no f/c, URI symptoms. No headaches, chest pain, shortness of breath. Diarrhea? N/v? LH?  weight  PHYSICAL EXAM:  There were no vitals taken for this visit.  Wt Readings from Last 3 Encounters:  04/09/22 214 lb 9.6 oz (97.3 kg)  01/08/22 230 lb 3.2 oz (104.4 kg)  11/16/21 240 lb 6.4 oz (109 kg)    Well-appearing female, in good spirits, in no distress HEENT: conjunctiva and sclera are clear, EOMI. Neck: no lymphadenopathy, thyromegaly or mass Heart: regular rate and rhythm Lungs: clear bilaterally Back: no spinal or CVA tenderness Abdomen: soft, nontender, no mass Extremities: no edema, normal pulses Psych: normal mood, affect ,hygiene and grooming Neuro: alert and oriented, cranial nerves grossly intact, normal gait.  ASSESSMENT/PLAN:  Med RF's due early Jan--send now?  Has CPE scheduled for 09/2022 Last labs done 03/2021 (at physical)--D was low. Consider c-met, D

## 2022-06-27 ENCOUNTER — Ambulatory Visit (INDEPENDENT_AMBULATORY_CARE_PROVIDER_SITE_OTHER): Payer: 59 | Admitting: Family Medicine

## 2022-06-27 ENCOUNTER — Other Ambulatory Visit (HOSPITAL_BASED_OUTPATIENT_CLINIC_OR_DEPARTMENT_OTHER): Payer: Self-pay

## 2022-06-27 ENCOUNTER — Encounter: Payer: Self-pay | Admitting: Family Medicine

## 2022-06-27 VITALS — BP 116/74 | HR 80 | Ht 68.0 in | Wt 203.0 lb

## 2022-06-27 DIAGNOSIS — E6609 Other obesity due to excess calories: Secondary | ICD-10-CM | POA: Diagnosis not present

## 2022-06-27 DIAGNOSIS — E559 Vitamin D deficiency, unspecified: Secondary | ICD-10-CM

## 2022-06-27 DIAGNOSIS — I10 Essential (primary) hypertension: Secondary | ICD-10-CM | POA: Diagnosis not present

## 2022-06-27 DIAGNOSIS — Z5181 Encounter for therapeutic drug level monitoring: Secondary | ICD-10-CM

## 2022-06-27 DIAGNOSIS — Z6832 Body mass index (BMI) 32.0-32.9, adult: Secondary | ICD-10-CM

## 2022-06-27 MED ORDER — WEGOVY 2.4 MG/0.75ML ~~LOC~~ SOAJ
2.4000 mg | SUBCUTANEOUS | 3 refills | Status: DC
  Filled 2022-06-27 – 2022-07-06 (×2): qty 3, 28d supply, fill #0
  Filled 2022-10-15 – 2022-10-26 (×2): qty 3, 28d supply, fill #1
  Filled 2022-12-04: qty 3, 28d supply, fill #2

## 2022-06-28 ENCOUNTER — Other Ambulatory Visit (HOSPITAL_BASED_OUTPATIENT_CLINIC_OR_DEPARTMENT_OTHER): Payer: Self-pay

## 2022-06-28 LAB — COMPREHENSIVE METABOLIC PANEL
ALT: 26 IU/L (ref 0–32)
AST: 16 IU/L (ref 0–40)
Albumin/Globulin Ratio: 1.4 (ref 1.2–2.2)
Albumin: 4.5 g/dL (ref 3.9–4.9)
Alkaline Phosphatase: 54 IU/L (ref 44–121)
BUN/Creatinine Ratio: 13 (ref 9–23)
BUN: 10 mg/dL (ref 6–20)
Bilirubin Total: 0.7 mg/dL (ref 0.0–1.2)
CO2: 24 mmol/L (ref 20–29)
Calcium: 9.9 mg/dL (ref 8.7–10.2)
Chloride: 97 mmol/L (ref 96–106)
Creatinine, Ser: 0.79 mg/dL (ref 0.57–1.00)
Globulin, Total: 3.2 g/dL (ref 1.5–4.5)
Glucose: 89 mg/dL (ref 70–99)
Potassium: 4 mmol/L (ref 3.5–5.2)
Sodium: 135 mmol/L (ref 134–144)
Total Protein: 7.7 g/dL (ref 6.0–8.5)
eGFR: 101 mL/min/{1.73_m2} (ref >59)

## 2022-06-28 LAB — VITAMIN D 25 HYDROXY (VIT D DEFICIENCY, FRACTURES): Vit D, 25-Hydroxy: 25.1 ng/mL — ABNORMAL LOW (ref 30.0–100.0)

## 2022-06-28 MED ORDER — VITAMIN D (ERGOCALCIFEROL) 1.25 MG (50000 UNIT) PO CAPS
50000.0000 [IU] | ORAL_CAPSULE | ORAL | 0 refills | Status: DC
  Filled 2022-06-28: qty 8, 56d supply, fill #0

## 2022-06-28 NOTE — Addendum Note (Signed)
Addended by: Rita Ohara on: 06/28/2022 08:38 AM   Modules accepted: Orders

## 2022-07-07 ENCOUNTER — Other Ambulatory Visit (HOSPITAL_BASED_OUTPATIENT_CLINIC_OR_DEPARTMENT_OTHER): Payer: Self-pay

## 2022-08-26 ENCOUNTER — Other Ambulatory Visit: Payer: Self-pay | Admitting: Family Medicine

## 2022-08-26 DIAGNOSIS — I10 Essential (primary) hypertension: Secondary | ICD-10-CM

## 2022-08-27 ENCOUNTER — Other Ambulatory Visit (HOSPITAL_BASED_OUTPATIENT_CLINIC_OR_DEPARTMENT_OTHER): Payer: Self-pay

## 2022-08-27 MED ORDER — HYDROCHLOROTHIAZIDE 25 MG PO TABS
ORAL_TABLET | ORAL | 0 refills | Status: AC
  Filled 2022-08-27: qty 90, 90d supply, fill #0

## 2022-09-23 IMAGING — MG MM DIGITAL DIAGNOSTIC UNILAT*L* W/ TOMO W/ CAD
6 series · 6 of 18 positions shown · non-contrast
Comparison: Baseline screening mammogram dated 03/09/2021.

CLINICAL DATA: Possible mass in outer left breast on a recent
baseline screening mammogram. The patient's mother was diagnosed
with breast cancer at age 34. Her mother's maternal 1st cousin was
diagnosed with breast cancer in her 30s. The patient and her mother
were genetically tested and neither had a breast cancer gene
mutation.

EXAM:
DIGITAL DIAGNOSTIC UNILATERAL LEFT MAMMOGRAM WITH TOMOSYNTHESIS AND
CAD; ULTRASOUND LEFT BREAST LIMITED
TECHNIQUE: Left digital diagnostic mammography and breast tomosynthesis was
performed. The images were evaluated with computer-aided detection.;
Targeted ultrasound examination of the left breast was performed.

[L CC synth-2D (1 of 2)]
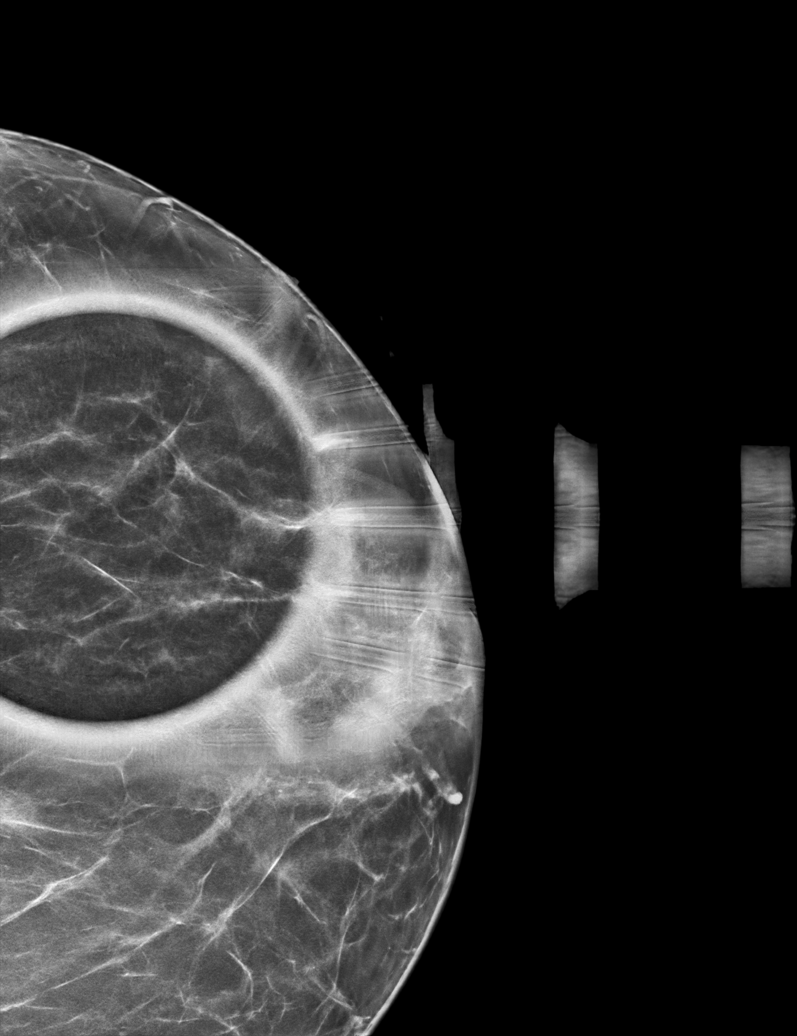

[L CC synth-2D (2 of 2)]
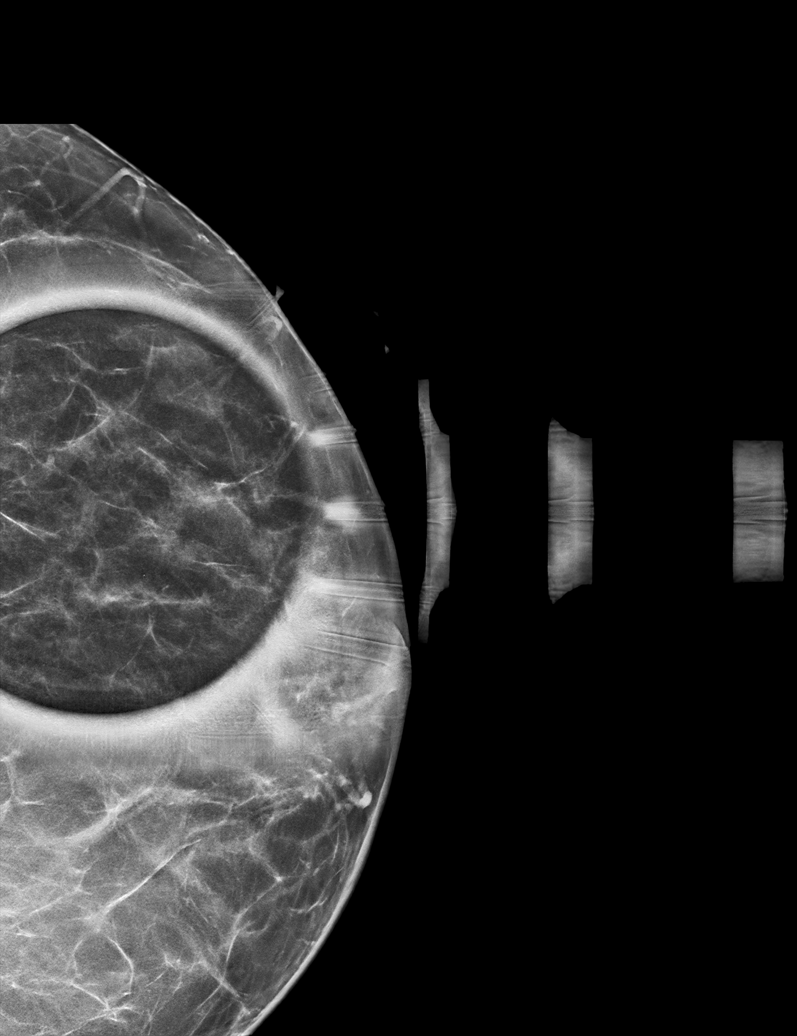

[L MLO synth-2D]
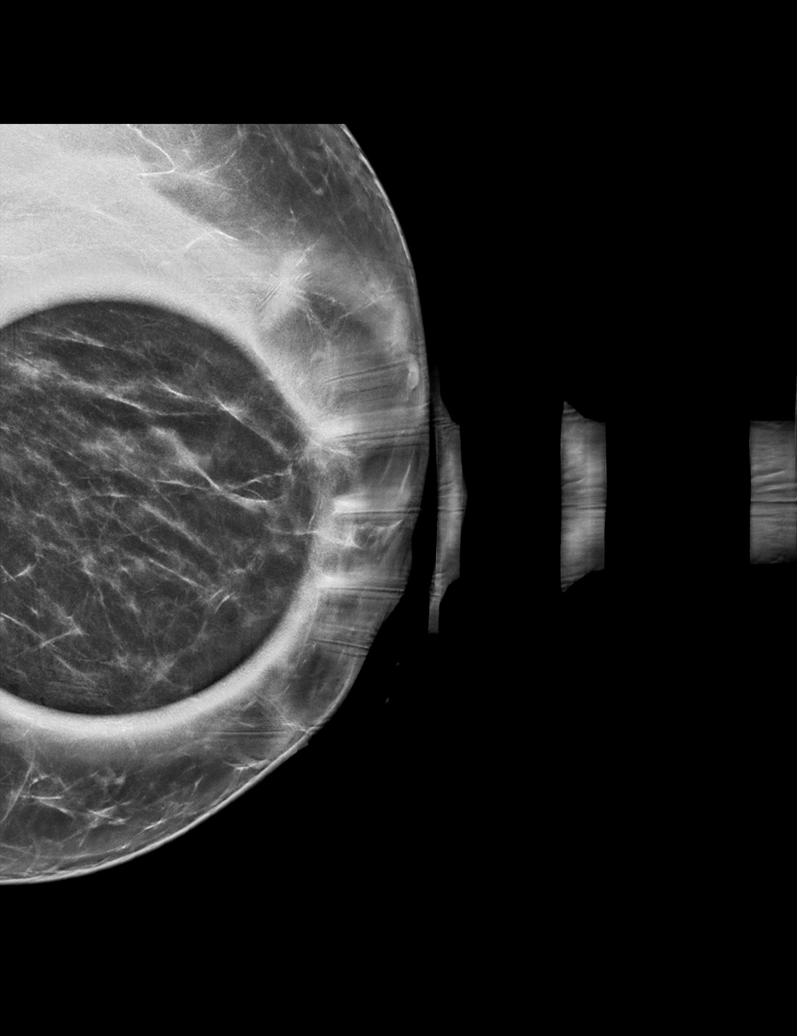

[L CC tomo (1 of 2) · tomo slice 33/65.0]
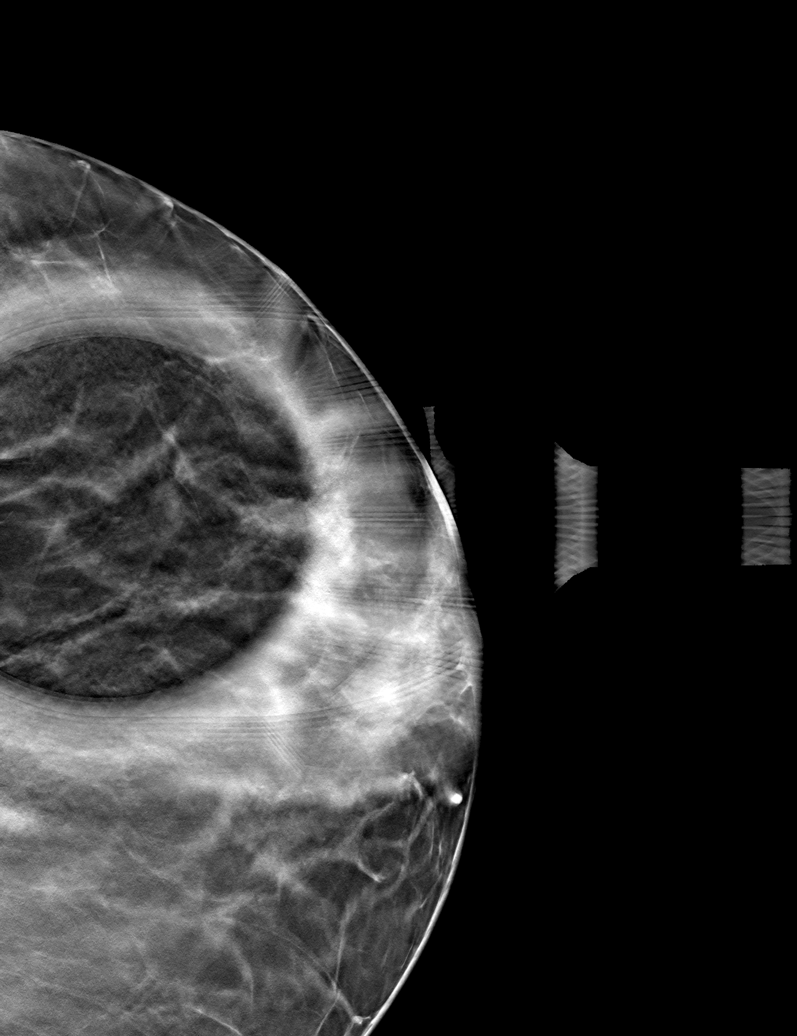

[L CC tomo (2 of 2) · tomo slice 31/60.0]
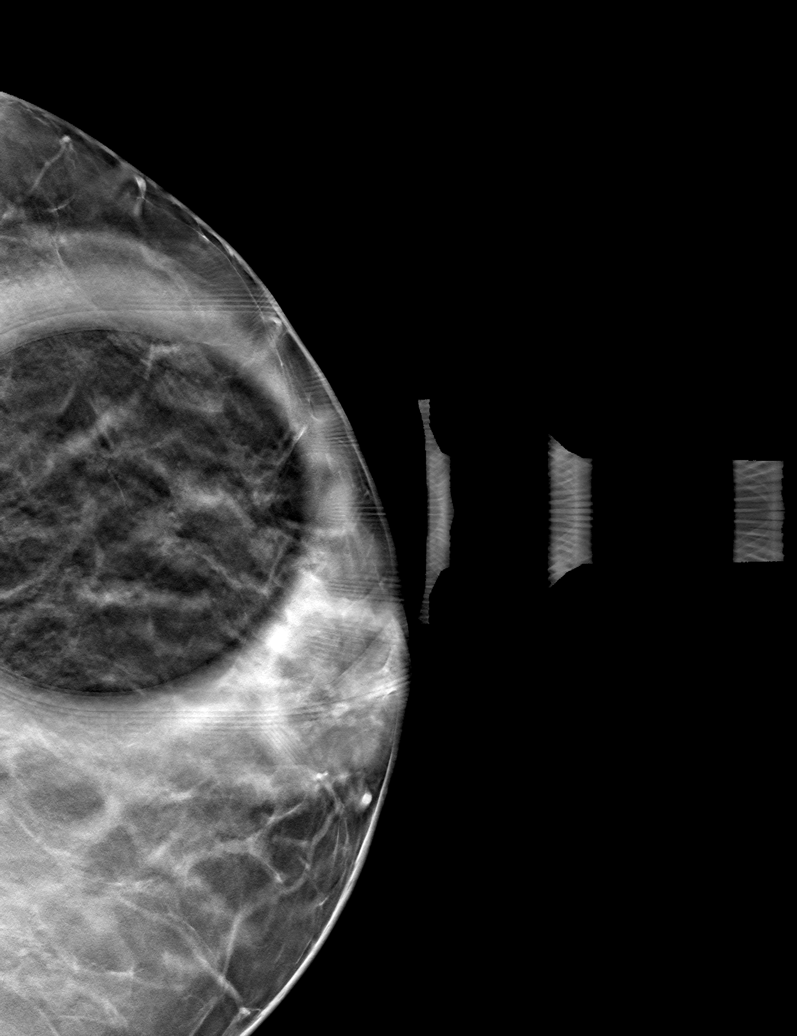

[L MLO tomo · tomo slice 36/71.0]
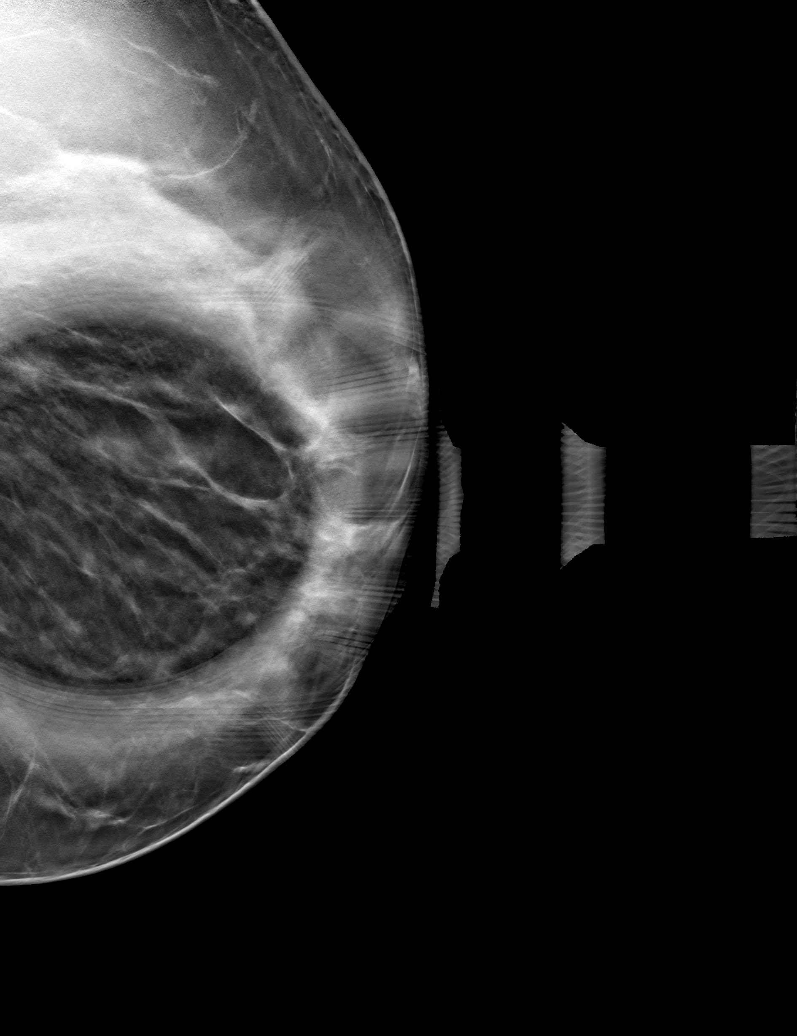

[6 of 18 positions shown; findings below may reference images not displayed]

ACR Breast Density Category c: The breast tissue is heterogeneously
dense, which may obscure small masses.
FINDINGS: 3D tomographic and 2D generated spot compression images of the left
breast confirm a 2.7 cm oval mass with circumscribed and obscured or
indistinct margins in the anterior aspect of the outer breast. This
has some fat density components.

On physical exam, no mass is palpable in the outer left breast.

Targeted ultrasound is performed, showing a 2.7 x 2.3 x 0.6 cm
horizontally oriented oval, circumscribed mass in the 3 o'clock
position of the left breast, 5 cm from the nipple. This contains
hypoechoic and interspersed echogenic components and corresponds to
the mammographic mass.
IMPRESSION: 2.7 cm fibroadenolipoma (hamartoma) in the 3 o'clock position of the
left breast. No evidence of malignancy.

RECOMMENDATION:
1. Bilateral screening mammogram in 11 months when due.
2. Per American Cancer Society guidelines, if the patient has a
calculated lifetime risk of developing breast cancer of greater than
20%, annual screening MRI of the breasts would also be recommended.

I have discussed the findings and recommendations with the patient.
If applicable, a reminder letter will be sent to the patient
regarding the next appointment.

BI-RADS CATEGORY  2: Benign.

## 2022-09-30 ENCOUNTER — Encounter: Payer: Self-pay | Admitting: Family Medicine

## 2022-10-02 NOTE — Patient Instructions (Incomplete)
  HEALTH MAINTENANCE RECOMMENDATIONS:  It is recommended that you get at least 30 minutes of aerobic exercise at least 5 days/week (for weight loss, you may need as much as 60-90 minutes). This can be any activity that gets your heart rate up. This can be divided in 10-15 minute intervals if needed, but try and build up your endurance at least once a week.  Weight bearing exercise is also recommended twice weekly.  Eat a healthy diet with lots of vegetables, fruits and fiber.  "Colorful" foods have a lot of vitamins (ie green vegetables, tomatoes, red peppers, etc).  Limit sweet tea, regular sodas and alcoholic beverages, all of which has a lot of calories and sugar.  Up to 1 alcoholic drink daily may be beneficial for women (unless trying to lose weight, watch sugars).  Drink a lot of water.  Calcium recommendations are 1200-1500 mg daily (1500 mg for postmenopausal women or women without ovaries), and vitamin D 1000 IU daily.  This should be obtained from diet and/or supplements (vitamins), and calcium should not be taken all at once, but in divided doses.  Monthly self breast exams and yearly mammograms for women over the age of 57 is recommended.  Sunscreen of at least SPF 30 should be used on all sun-exposed parts of the skin when outside between the hours of 10 am and 4 pm (not just when at beach or pool, but even with exercise, golf, tennis, and yard work!)  Use a sunscreen that says "broad spectrum" so it covers both UVA and UVB rays, and make sure to reapply every 1-2 hours.  Remember to change the batteries in your smoke detectors when changing your clock times in the spring and fall. Carbon monoxide detectors are recommended for your home.  Use your seat belt every time you are in a car, and please drive safely and not be distracted with cell phones and texting while driving.  Please schedule your GYN exam and mammogram (if appropriate).  Consider a COVID booster.

## 2022-10-02 NOTE — Progress Notes (Unsigned)
No chief complaint on file.   Tamara Spencer is a 32 y.o. female who presents for a complete physical.  She sees GYN. She is also here for follow-up on obesity/weight loss treatment and hypertension.   She has been on Wegovy 2.4 (and 1.7, depending on the availability). She is tolerating this without side effects overall.  She previously felt nauseated if she went to long without eating anything (if she ate after 10am).  She eats first thing in the morning, and no longer has any nausea. In December she reported that she switched from counting calories to monitoring her fruit/protein/veggie/water goals--how Weight [employer] does it for people on these medications.  Wt Readings from Last 3 Encounters:  06/27/22 203 lb (92.1 kg)  04/09/22 214 lb 9.6 oz (97.3 kg)  01/08/22 230 lb 3.2 oz (104.4 kg)    HTN: She is compliant in taking 25 mg of HCTZ daily (in 03/2022 she had decreased the dose to 12.5 mg, due to some dizziness with standing.  She increased back to 25mg  after getting new monitor and seeing diastolics of 83 or higher). BP's at home over the month of March ranged from 93-119/71-88.   BP Readings from Last 3 Encounters:  06/27/22 116/74  04/09/22 124/88  01/08/22 131/82    She drinks three 32 oz of water daily. She denies side effects, muscle cramps.  She denies headaches, chest pain, edema.  She tries to follow low sodium diet.    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  She continues to see a therapist.  Tried to wean off, wasn't ready, had recurrent anxiety, irritability. She denies side effects. UPDATE--not on med list  History of vitamin D deficiency.  Level was 25.1 in 06/2022, when taking 1000 IU of D3.  She was prescribed 8 wees of Rx D, then advised to start 2000 IU daily upon completion.  She is currently taking   Immunization History  Administered Date(s) Administered   HPV Quadrivalent 02/02/2011, 03/15/2011, 07/06/2011    Influenza Split 04/08/2012, 04/12/2014   Influenza Whole 04/15/2013   Influenza,inj,Quad PF,6+ Mos 03/02/2019, 04/09/2022   Influenza-Unspecified 04/20/2016, 04/15/2017, 04/28/2018, 03/23/2020, 05/05/2021   PFIZER(Purple Top)SARS-COV-2 Vaccination 07/30/2019, 08/19/2019   Td 07/09/2002   Tdap 04/11/2009, 02/26/2018  Had COVID in 07/2020.  Had 2 COVID vaccines [employer]), no booster. Last Pap smear:  UTD through [place] was 2019, rec q 97yrs. Last mammogram: 04/2021, and [place] of L breast.  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 No evidence of malignancy. Has had genetic testing for breast cancer. Last colonoscopy: never Last DEXA: never Dentist: twice yearly Ophtho: yearly Exercise:  still using an under-desk treadmill while working from home (4 days/week for a half day). Had been going to [place] 2-3x/week for an hour HOW OFTEN  Lipid: Lab Results  Component Value Date   CHOL 140 03/02/2019   HDL 43 03/02/2019   LDLCALC 87 03/02/2019   TRIG 52 03/02/2019   CHOLHDL 3.3 03/02/2019     PMH, PSH, SH and FH were reviewed and updated.     ROS:  The patient denies anorexia, fever, vision changes, headaches, decreased hearing, ear pain, sore throat, breast concerns, chest pain, palpitations, dizziness, syncope, dyspnea on exertion (resolved--noted when inactive and heavier), cough, swelling, nausea, vomiting, diarrhea, constipation, abdominal pain, melena, hematochezia, indigestion/heartburn, hematuria, incontinence, dysuria, irregular menstrual cycles, vaginal discharge, odor or itch, genital lesions, joint pains, numbness, tingling,  weakness, tremor, suspicious skin lesions, depression, abnormal bleeding/bruising, or enlarged lymph nodes. Anxiety per HPI, controlled.  Weight loss   PHYSICAL EXAM:  There were no vitals taken for this visit.  Wt Readings from Last 3 Encounters:  06/27/22 203 lb (92.1 kg)  04/09/22 214 lb  9.6 oz (97.3 kg)  01/08/22 230 lb 3.2 oz (104.4 kg)   General Appearance:    Alert, cooperative, no distress, appears stated age  Head:    Normocephalic, without obvious abnormality, atraumatic  Eyes:    PERRL, conjunctiva/corneas clear, EOM's intact, fundi    Benign. Right iris--brown color superiorly, as well as a brown spot at 4 o'clock position, unchanged  Ears:    Normal TM's and external ear canals  Nose:   Not examined (wearing mask due to COVID-19 pandemic)  Throat:   Not examined (wearing mask due to COVID-19 pandemic)  Neck:   Supple, no lymphadenopathy;  thyroid:  no enlargement/ tenderness/nodules; no carotidbruit or JVD  Back:    Spine nontender, no curvature, ROM normal, no CVA  tenderness  Lungs:     Clear to auscultation bilaterally without wheezes, rales or  ronchi; respirations unlabored  Chest Wall:    No tenderness or deformity   Heart:    Regular rate and rhythm, S1 and S2 normal, no rub or gallop. Faint murmur noted at RUSB.  Breast Exam:    Deferred to GYN  Abdomen:     Soft, non-tender, nondistended, normoactive bowel sounds,    no masses, no hepatosplenomegaly  Genitalia:    Deferred to GYN       Extremities:   No clubbing, cyanosis or edema  Pulses:   2+ and symmetric all extremities  Skin:   Skin color, texture, turgor normal. Hypertrophic scar left shoulder/upper arm.    Lymph nodes:   Cervical, supraclavicular, and inguinal nodes normal  Neurologic:   Normal strength, sensation and gait; reflexes 2+ and symmetric throughout                              Psych:   Normal mood, affect, hygiene and grooming.               ***UPDATE if not wearing mask ?faint murmur at RUSB? Scar L shoulder/arm   ASSESSMENT/PLAN:  Offer/decline COVID booster Is she taking sertraline??  When did she stop?  (No longer on med list, used to get from GYN for anxiety). If still taking, needs GAD-7 (and phq-2)  When did she last see GYN,and do we have records??  Get proper  dose of D3 she is taking  Cbc, c-met, vit D  lipids HepC if not done by GYN  Discussed monthly self breast exams and yearly mammograms; at least 30 minutes of aerobic activity at least 5 days/week, weight-bearing exercise at least 2x/week; proper sunscreen use reviewed; healthy diet, including goals of calcium and vitamin D intake and alcohol recommendations (less than or equal to 1 drink/day) reviewed; regular seatbelt use; changing batteries in smoke detectors.  Immunization recommendations discussed--continue yearly flu shots. Updated COVID vaccine   Colonoscopy recommendations reviewed (age 26)

## 2022-10-03 ENCOUNTER — Encounter: Payer: Self-pay | Admitting: Family Medicine

## 2022-10-03 ENCOUNTER — Other Ambulatory Visit (HOSPITAL_BASED_OUTPATIENT_CLINIC_OR_DEPARTMENT_OTHER): Payer: Self-pay

## 2022-10-03 ENCOUNTER — Ambulatory Visit (INDEPENDENT_AMBULATORY_CARE_PROVIDER_SITE_OTHER): Payer: 59 | Admitting: Family Medicine

## 2022-10-03 VITALS — BP 120/78 | HR 68 | Ht 68.0 in | Wt 196.0 lb

## 2022-10-03 DIAGNOSIS — Z1159 Encounter for screening for other viral diseases: Secondary | ICD-10-CM | POA: Diagnosis not present

## 2022-10-03 DIAGNOSIS — Z5181 Encounter for therapeutic drug level monitoring: Secondary | ICD-10-CM

## 2022-10-03 DIAGNOSIS — E559 Vitamin D deficiency, unspecified: Secondary | ICD-10-CM

## 2022-10-03 DIAGNOSIS — Z0000 Encounter for general adult medical examination without abnormal findings: Secondary | ICD-10-CM

## 2022-10-03 DIAGNOSIS — E669 Obesity, unspecified: Secondary | ICD-10-CM | POA: Diagnosis not present

## 2022-10-03 DIAGNOSIS — I10 Essential (primary) hypertension: Secondary | ICD-10-CM | POA: Diagnosis not present

## 2022-10-03 MED ORDER — WEGOVY 1.7 MG/0.75ML ~~LOC~~ SOAJ
1.7000 mg | SUBCUTANEOUS | 3 refills | Status: DC
  Filled 2022-10-03: qty 3, 28d supply, fill #0

## 2022-10-04 LAB — CBC WITH DIFFERENTIAL/PLATELET
Basophils Absolute: 0 10*3/uL (ref 0.0–0.2)
Basos: 0 %
EOS (ABSOLUTE): 0.1 10*3/uL (ref 0.0–0.4)
Eos: 1 %
Hematocrit: 42 % (ref 34.0–46.6)
Hemoglobin: 13.7 g/dL (ref 11.1–15.9)
Immature Grans (Abs): 0 10*3/uL (ref 0.0–0.1)
Immature Granulocytes: 0 %
Lymphocytes Absolute: 1.8 10*3/uL (ref 0.7–3.1)
Lymphs: 23 %
MCH: 26.3 pg — ABNORMAL LOW (ref 26.6–33.0)
MCHC: 32.6 g/dL (ref 31.5–35.7)
MCV: 81 fL (ref 79–97)
Monocytes Absolute: 0.5 10*3/uL (ref 0.1–0.9)
Monocytes: 6 %
Neutrophils Absolute: 5.4 10*3/uL (ref 1.4–7.0)
Neutrophils: 70 %
Platelets: 353 10*3/uL (ref 150–450)
RBC: 5.2 x10E6/uL (ref 3.77–5.28)
RDW: 13.6 % (ref 11.7–15.4)
WBC: 7.7 10*3/uL (ref 3.4–10.8)

## 2022-10-04 LAB — COMPREHENSIVE METABOLIC PANEL
ALT: 19 IU/L (ref 0–32)
AST: 17 IU/L (ref 0–40)
Albumin/Globulin Ratio: 1.6 (ref 1.2–2.2)
Albumin: 4.9 g/dL (ref 3.9–4.9)
Alkaline Phosphatase: 57 IU/L (ref 44–121)
BUN/Creatinine Ratio: 14 (ref 9–23)
BUN: 10 mg/dL (ref 6–20)
Bilirubin Total: 0.8 mg/dL (ref 0.0–1.2)
CO2: 24 mmol/L (ref 20–29)
Calcium: 10.5 mg/dL — ABNORMAL HIGH (ref 8.7–10.2)
Chloride: 98 mmol/L (ref 96–106)
Creatinine, Ser: 0.74 mg/dL (ref 0.57–1.00)
Globulin, Total: 3.1 g/dL (ref 1.5–4.5)
Glucose: 86 mg/dL (ref 70–99)
Potassium: 4.7 mmol/L (ref 3.5–5.2)
Sodium: 137 mmol/L (ref 134–144)
Total Protein: 8 g/dL (ref 6.0–8.5)
eGFR: 109 mL/min/{1.73_m2} (ref >59)

## 2022-10-04 LAB — LIPID PANEL
Chol/HDL Ratio: 3.8 ratio (ref 0.0–4.4)
Cholesterol, Total: 148 mg/dL (ref 100–199)
HDL: 39 mg/dL — ABNORMAL LOW (ref >39)
LDL Chol Calc (NIH): 95 mg/dL (ref 0–99)
Triglycerides: 72 mg/dL (ref 0–149)
VLDL Cholesterol Cal: 14 mg/dL (ref 5–40)

## 2022-10-04 LAB — VITAMIN D 25 HYDROXY (VIT D DEFICIENCY, FRACTURES): Vit D, 25-Hydroxy: 28.9 ng/mL — ABNORMAL LOW (ref 30.0–100.0)

## 2022-10-04 LAB — HEPATITIS C ANTIBODY: Hep C Virus Ab: NONREACTIVE

## 2022-10-15 ENCOUNTER — Other Ambulatory Visit: Payer: Self-pay

## 2022-10-15 ENCOUNTER — Other Ambulatory Visit (HOSPITAL_BASED_OUTPATIENT_CLINIC_OR_DEPARTMENT_OTHER): Payer: Self-pay

## 2022-10-23 ENCOUNTER — Other Ambulatory Visit: Payer: 59

## 2022-10-26 ENCOUNTER — Other Ambulatory Visit (HOSPITAL_BASED_OUTPATIENT_CLINIC_OR_DEPARTMENT_OTHER): Payer: Self-pay

## 2022-10-30 ENCOUNTER — Telehealth: Payer: Self-pay

## 2022-10-30 NOTE — Telephone Encounter (Signed)
PA Wegovy 2.4mg  is not covered under pharmacy benefit. PA not available.

## 2022-11-05 ENCOUNTER — Other Ambulatory Visit (HOSPITAL_BASED_OUTPATIENT_CLINIC_OR_DEPARTMENT_OTHER): Payer: Self-pay

## 2022-12-04 ENCOUNTER — Other Ambulatory Visit (HOSPITAL_BASED_OUTPATIENT_CLINIC_OR_DEPARTMENT_OTHER): Payer: Self-pay

## 2023-03-26 DIAGNOSIS — H5213 Myopia, bilateral: Secondary | ICD-10-CM | POA: Diagnosis not present

## 2023-05-22 ENCOUNTER — Other Ambulatory Visit (HOSPITAL_BASED_OUTPATIENT_CLINIC_OR_DEPARTMENT_OTHER): Payer: Self-pay

## 2023-05-22 DIAGNOSIS — Z1151 Encounter for screening for human papillomavirus (HPV): Secondary | ICD-10-CM | POA: Diagnosis not present

## 2023-05-22 DIAGNOSIS — Z6834 Body mass index (BMI) 34.0-34.9, adult: Secondary | ICD-10-CM | POA: Diagnosis not present

## 2023-05-22 DIAGNOSIS — Z124 Encounter for screening for malignant neoplasm of cervix: Secondary | ICD-10-CM | POA: Diagnosis not present

## 2023-05-22 DIAGNOSIS — F419 Anxiety disorder, unspecified: Secondary | ICD-10-CM | POA: Diagnosis not present

## 2023-05-22 DIAGNOSIS — Z01419 Encounter for gynecological examination (general) (routine) without abnormal findings: Secondary | ICD-10-CM | POA: Diagnosis not present

## 2023-05-22 MED ORDER — SERTRALINE HCL 50 MG PO TABS
50.0000 mg | ORAL_TABLET | Freq: Every morning | ORAL | 3 refills | Status: DC
  Filled 2023-05-22: qty 30, 30d supply, fill #0
  Filled 2023-06-17: qty 30, 30d supply, fill #1
  Filled 2023-07-27: qty 30, 30d supply, fill #2
  Filled 2023-08-25: qty 30, 30d supply, fill #3

## 2023-06-17 DIAGNOSIS — Z1239 Encounter for other screening for malignant neoplasm of breast: Secondary | ICD-10-CM | POA: Diagnosis not present

## 2023-08-25 ENCOUNTER — Other Ambulatory Visit (HOSPITAL_BASED_OUTPATIENT_CLINIC_OR_DEPARTMENT_OTHER): Payer: Self-pay

## 2023-08-25 MED ORDER — NORETHINDRONE ACETATE 5 MG PO TABS
5.0000 mg | ORAL_TABLET | Freq: Three times a day (TID) | ORAL | 0 refills | Status: AC
  Filled 2023-08-25: qty 30, 10d supply, fill #0

## 2023-10-07 ENCOUNTER — Encounter: Payer: 59 | Admitting: Family Medicine

## 2023-11-08 ENCOUNTER — Other Ambulatory Visit (HOSPITAL_BASED_OUTPATIENT_CLINIC_OR_DEPARTMENT_OTHER): Payer: Self-pay

## 2023-11-12 ENCOUNTER — Other Ambulatory Visit (HOSPITAL_BASED_OUTPATIENT_CLINIC_OR_DEPARTMENT_OTHER): Payer: Self-pay

## 2023-11-12 MED ORDER — SERTRALINE HCL 50 MG PO TABS
50.0000 mg | ORAL_TABLET | Freq: Every morning | ORAL | 1 refills | Status: AC
  Filled 2023-11-12: qty 90, 90d supply, fill #0

## 2023-11-14 ENCOUNTER — Other Ambulatory Visit (HOSPITAL_BASED_OUTPATIENT_CLINIC_OR_DEPARTMENT_OTHER): Payer: Self-pay

## 2023-11-14 ENCOUNTER — Telehealth: Payer: Self-pay | Admitting: Physician Assistant

## 2023-11-14 DIAGNOSIS — L0390 Cellulitis, unspecified: Secondary | ICD-10-CM

## 2023-11-14 MED ORDER — CEPHALEXIN 500 MG PO CAPS
500.0000 mg | ORAL_CAPSULE | Freq: Four times a day (QID) | ORAL | 0 refills | Status: AC
  Filled 2023-11-14: qty 20, 5d supply, fill #0

## 2023-11-14 MED ORDER — PREDNISONE 20 MG PO TABS
40.0000 mg | ORAL_TABLET | Freq: Every day | ORAL | 0 refills | Status: AC
  Filled 2023-11-14: qty 10, 5d supply, fill #0

## 2023-11-14 NOTE — Progress Notes (Signed)
 E-Visit for Insect Sting  Thank you for describing the insect sting for us .  Here is how we plan to help!  Based on the information you have shared with me it looks like you have: A sting or bite that we will treat with a short dose of prednisone  and an antibiotic.   The 2 greatest risks from insect stings are allergic reaction, which can be fatal in some people and infection, which is more common and less serious.  Bees, wasps, yellow jackets, and hornets belong to a class of insects called Hymenoptera.  Most insect stings cause only minor discomfort.  Stings can happen anywhere on the body and can be painful.  Most stings are from honey bees or yellow jackets.  Fire ants can sting multiple times.  The sites of the stings are more likely to become infected.    Based on your information I have: Provided a home care guide for insect stings and instructions on when to call for help, and I have sent in prednisone  40 mg by mouth daily for 5 days AND Keflex  500mg  Take 1 capsule four times daily while awake to the pharmacy you selected.  Please make sure that you selected a pharmacy that is open now.  What can be used to prevent Insect Stings?  Insect repellant with at least 20% DEET.  Wearing long pants and shirts with socks and shoes.  Wear dark or drab-colored clothes rather than bright colors.  Avoid using perfumes and hair sprays; these attract insects.  HOME CARE ADVICE:  1. Stinger removal: The stinger looks like a tiny black dot in the sting. Use a fingernail, credit card edge, or knife-edge to scrape it off.  Don't pull it out because it squeezes out more venom. If the stinger is below the skin surface, leave it alone.  It will be shed with normal skin healing. 2. Use cold compresses to the area of the sting for 10-20 minutes.  You may repeat this as needed to relieve symptoms of pain and swelling. 3.  For pain relief, take acetominophen 650 mg 4-6 hours as needed or ibuprofen  400 mg  every 6-8 hours as needed or naproxen 250-500 mg every 12 hours as needed. 4.  You can also use hydrocortisone cream 0.5% or 1% up to 4 times daily as needed for itching. 5.  If the sting becomes very itchy, take Benadryl  25-50 mg, follow directions on box. 6.  Wash the area 2-3 times daily with antibacterial soap and warm water. 7. Call your Doctor if: Fever, a severe headache, or rash occur in the next 2 weeks. Sting area begins to look infected. Redness and swelling worsens after home treatment. Your current symptoms become worse.    MAKE SURE YOU:  Understand these instructions. Will watch your condition. Will get help right away if you are not doing well or get worse.  Thank you for choosing an e-visit.  Your e-visit answers were reviewed by a board certified advanced clinical practitioner to complete your personal care plan. Depending upon the condition, your plan could have included both over the counter or prescription medications.  Please review your pharmacy choice. Make sure the pharmacy is open so you can pick up prescription now. If there is a problem, you may contact your provider through [employer] and have the prescription routed to another pharmacy.  Your safety is important to us . If you have drug allergies check your prescription carefully.   For the next 24 hours  you can use MyChart to ask questions about today's visit, request a non-urgent call back, or ask for a work or school excuse. You will get an email in the next two days asking about your experience. I hope that your e-visit has been valuable and will speed your recovery.    I have spent 5 minutes in review of e-visit questionnaire, review and updating patient chart, medical decision making and response to patient.   Angelia Kelp, PA-C

## 2024-02-05 NOTE — Progress Notes (Signed)
 Medical Nutrition Therapy  Appointment Start time:  1515  Appointment End time:  1611  Primary concerns today: Pt desires weight reductions to a lower BMI range   Referral diagnosis: employee visit 1 Preferred learning style: no preference indicated Learning readiness:  ready-change in progress  NUTRITION ASSESSMENT   Clinical Medical Hx:  Past Medical History:  Diagnosis Date   Anxiety 2020   Starting as post partum anxiety and then kind of stuck around   Headache    monthly; less severe since on new OCP   Hypertension    Recurrent cold sores     Medications:  Current Outpatient Medications:    cephALEXin  (KEFLEX ) 500 MG capsule, Take 1 capsule (500 mg total) by mouth 4 (four) times daily., Disp: 20 capsule, Rfl: 0   FIBER SELECT GUMMIES PO, Take 2 each by mouth daily., Disp: , Rfl:    hydrochlorothiazide  (HYDRODIURIL ) 25 MG tablet, Take 1/2 to 1 tablet by mouth as directed by MD. (Patient taking differently: Take 25 mg by mouth daily. Take 1/2 to 1 tablet by mouth as directed by MD.), Disp: 90 tablet, Rfl: 0   Multiple Vitamins-Minerals (EQ MULTIVITAMINS ADULT GUMMY PO), Take 1 tablet by mouth daily., Disp: , Rfl:    norethindrone  (AYGESTIN ) 5 MG tablet, Start 3 days before expected period: Take 1 tablet (5 mg total) by mouth 3 (three) times daily for 10 days., Disp: 30 tablet, Rfl: 0   predniSONE  (DELTASONE ) 20 MG tablet, Take 2 tablets (40 mg total) by mouth daily with breakfast., Disp: 10 tablet, Rfl: 0   sertraline  (ZOLOFT ) 50 MG tablet, Take 1 tablet (50 mg total) by mouth every morning., Disp: 90 tablet, Rfl: 1   Vitamin D , Cholecalciferol, 25 MCG (1000 UT) CAPS, Take 1 capsule by mouth daily., Disp: , Rfl:    Labs: No results found for: HGBA1C Lab Results  Component Value Date   CHOL 148 10/03/2022   HDL 39 (L) 10/03/2022   LDLCALC 95 10/03/2022   TRIG 72 10/03/2022   CHOLHDL 3.8 10/03/2022    Lab Results  Component Value Date   NA 137 10/03/2022   CL 98  10/03/2022   K 4.7 10/03/2022   CO2 24 10/03/2022   BUN 10 10/03/2022   CREATININE 0.74 10/03/2022   EGFR 109 10/03/2022   CALCIUM 10.5 (H) 10/03/2022   ALBUMIN 4.9 10/03/2022   GLUCOSE 86 10/03/2022   Last vitamin D  Lab Results  Component Value Date   VD25OH 28.9 (L) 10/03/2022   Lifestyle & Dietary Hx Pt present today alone. Pt report a family history of diabetes. Pt reports she desires to utilize lifestyle changes to improve her overall health. Pt reports she recently started the gym. Pt reports creating a caloric deficit and states she has not had desired weight reductions. Pt reports an increase of stress. 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 Pt reports she lives with her husband and two children and does the cooking and shopping. 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 Pt reports she has been eating out more often. Pt reports the following changes have not been helpful including weight watchers, tracking dietary intake, increasing protein and decreasing regular soda intake. Pt reports using Wegovy  was effective and once no longer approved she experienced rebound weight gain. Pt reports her appetite is hard to control and feels she with emotional eat at times. Pt reports she is slowly decreasing the amount of  sugar in her tea stating a goal of omission in the next couple of months. All Pt's questions were answered during this encounter.   Estimated daily fluid intake: 120 oz Supplements: none Sleep: moderate 6-8 hours nightly on average Stress / self-care: 8-9 out of 10 /self care includes: gym, bath, planning ahead, breath Current average weekly physical activity: gym circuit training, treadmill, elliptical, weights, classes 4/d/ for 30-45 minutes + dance video once weekly  24-Hr Dietary Recall First Meal: over night oats or special K with berries, 2% or fairlife protein Fat free  Snack: none Second Meal: skips  1/d/w or rice, chicken, 1 pita, cucumber sauce, half sweet and half un sweet tea Snack: protein  bar  Third Meal: skips 1/d/w or egg, 1 slice of bacon, biscuit, water or grilled chicken nuggets, fries, half sweet and half un sweet tea or lasagna with cheese only, garlic bread  Snack: none Beverages: half sweet and half un sweet tea,  2% or fairlife protein Fat free   NUTRITION DIAGNOSIS  NB-1.1 Food and nutrition-related knowledge deficit As related to no prior nutrition related education.  As evidenced by Pt reports and dietary recall.   NUTRITION INTERVENTION  Nutrition education (E-1) on the following topics:  Fruits & Vegetables: Aim to fill half your plate with a variety of fruits and vegetables. They are rich in vitamins, minerals, and fiber, and can help reduce the risk of chronic diseases. Choose a colorful assortment of fruits and vegetables to ensure you get a wide range of nutrients. Grains and Starches: Make at least half of your grain choices whole grains, such as brown rice, whole wheat bread, and oats. Whole grains provide fiber, which aids in digestion and healthy cholesterol levels. Aim for whole forms of starchy vegetables such as potatoes, sweet potatoes, beans, peas, and corn, which are fiber rich and provide many vitamins and minerals.  Protein: Incorporate lean sources of protein, such as poultry, fish, beans, nuts, and seeds, into your meals. Protein is essential for building and repairing tissues, staying full, balancing blood sugar, as well as supporting immune function. Dairy: Include low-fat or fat-free dairy products like milk, yogurt, and cheese in your diet. Dairy foods are excellent sources of calcium and vitamin D , which are crucial for bone health.  Physical Activity: Aim for 60 minutes of physical activity daily. Regular physical activity promotes overall health-including helping to reduce risk for heart disease and diabetes, promoting mental health, and helping us   sleep better.    Handouts Provided Include  Plate Planner-[employer] Move Your way- [employer]HS  Learning Style & Readiness for Change Teaching method utilized: Visual & Auditory  Demonstrated degree of understanding via: Teach Back  Barriers to learning/adherence to lifestyle change: stress  Goals Established by Pt Aim for balanced meals using the plate planner Aim for 2 serving of calcium-rich vitamin D  daily   MONITORING & EVALUATION Dietary intake, weekly physical activity  Next Steps  Patient is to return PRN.

## 2024-02-12 ENCOUNTER — Encounter: Attending: Family Medicine | Admitting: Dietician

## 2024-02-12 DIAGNOSIS — Z6838 Body mass index (BMI) 38.0-38.9, adult: Secondary | ICD-10-CM | POA: Insufficient documentation

## 2024-02-12 DIAGNOSIS — I10 Essential (primary) hypertension: Secondary | ICD-10-CM | POA: Insufficient documentation

## 2024-02-12 DIAGNOSIS — E66812 Obesity, class 2: Secondary | ICD-10-CM | POA: Insufficient documentation

## 2024-02-12 NOTE — Patient Instructions (Signed)
 Aim for balanced meals using the plate planner Aim for 2 servings of calcium-rich vitamin D  daily

## 2024-03-04 ENCOUNTER — Ambulatory Visit: Admitting: Dietician

## 2024-04-21 ENCOUNTER — Ambulatory Visit (INDEPENDENT_AMBULATORY_CARE_PROVIDER_SITE_OTHER)

## 2024-04-21 ENCOUNTER — Ambulatory Visit (INDEPENDENT_AMBULATORY_CARE_PROVIDER_SITE_OTHER): Admitting: Student

## 2024-04-21 DIAGNOSIS — S90111A Contusion of right great toe without damage to nail, initial encounter: Secondary | ICD-10-CM

## 2024-04-21 DIAGNOSIS — M79674 Pain in right toe(s): Secondary | ICD-10-CM

## 2024-04-21 NOTE — Progress Notes (Signed)
 Chief Complaint: Right great toe injury     History of Present Illness:    Tamara  Spencer is a pleasant 33 y.o. female who presents today for evaluation of an injury to her right great toe.  She states that yesterday she dropped a fully filled 40 ounce water bottle from shoulder height which landed directly on her big toe.  She is having pain with weightbearing and is wearing flip-flops due to being unable to tolerate tennis shoes.  There is tenderness to touch as well as bruising around the toe.  She has tried ice and ibuprofen  for relief.  Denies any previous injuries to the foot or toe.   Surgical History:   None  PMH/PSH/Family History/Social History/Meds/Allergies:    Past Medical History:  Diagnosis Date   Anxiety 2020   Starting as post partum anxiety and then kind of stuck around   Headache    monthly; less severe since on new OCP   Hypertension    Recurrent cold sores    Past Surgical History:  Procedure Laterality Date   KELOID EXCISION     WISDOM TOOTH EXTRACTION     Social History   Socioeconomic History   Marital status: Married    Spouse name: Not on file   Number of children: 0   Years of education: Not on file   Highest education level: Not on file  Occupational History   Occupation: Chiropractor team    Employer: [employer]  Tobacco Use   Smoking status: Never   Smokeless tobacco: Never  Vaping Use   Vaping status: Never Used  Substance and Sexual Activity   Alcohol use: No   Drug use: No   Sexual activity: Yes    Partners: Male    Birth control/protection: Condom    Comment: mini-pill  Other Topics Concern   Not on file  Social History Narrative   Married, 2 sons ([place], Jax born 05/23/18)   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   Oldest son is autistic, home-schooled.      Updated 09/2022   Social Drivers of Health   Financial Resource Strain: Low Risk  (05/19/2018)   Overall Financial  Resource Strain (CARDIA)    Difficulty of Paying Living Expenses: Not hard at all  Food Insecurity: No Food Insecurity (02/12/2024)   Hunger Vital Sign    Worried About Running Out of Food in the Last Year: Never true    Ran Out of Food in the Last Year: Never true  Transportation Needs: Unknown (05/19/2018)   PRAPARE - Administrator, Civil Service (Medical): No    Lack of Transportation (Non-Medical): Not on file  Physical Activity: Not on file  Stress: No Stress Concern Present (05/19/2018)   [employer] of Occupational Health - Occupational Stress Questionnaire    Feeling of Stress : Only a little  Social Connections: Not on file   Family History  Problem Relation Age of Onset   Other Mother        Myleofibrosis   Breast cancer Mother 70   Cancer Mother    Diabetes Father    Hypertension Father    Hyperlipidemia Father    Heart disease Father 61   Kidney disease Father    Liver cancer Maternal Grandmother  Cancer Maternal Grandfather    Pancreatic cancer Maternal Grandfather    Diabetes Paternal Grandfather    Leukemia Cousin 36       mother's maternal first cousin   Breast cancer Cousin        mother's materanl first cousin dx in her 61s   Breast cancer Other        dx in her 58s   Allergies  Allergen Reactions   Adhesive [Tape] Rash   Current Outpatient Medications  Medication Sig Dispense Refill   cephALEXin  (KEFLEX ) 500 MG capsule Take 1 capsule (500 mg total) by mouth 4 (four) times daily. 20 capsule 0   FIBER SELECT GUMMIES PO Take 2 each by mouth daily.     hydrochlorothiazide  (HYDRODIURIL ) 25 MG tablet Take 1/2 to 1 tablet by mouth as directed by MD. (Patient taking differently: Take 25 mg by mouth daily. Take 1/2 to 1 tablet by mouth as directed by MD.) 90 tablet 0   Multiple Vitamins-Minerals (EQ MULTIVITAMINS ADULT GUMMY PO) Take 1 tablet by mouth daily.     norethindrone  (AYGESTIN ) 5 MG tablet Start 3 days before expected period: Take 1  tablet (5 mg total) by mouth 3 (three) times daily for 10 days. 30 tablet 0   predniSONE  (DELTASONE ) 20 MG tablet Take 2 tablets (40 mg total) by mouth daily with breakfast. 10 tablet 0   sertraline  (ZOLOFT ) 50 MG tablet Take 1 tablet (50 mg total) by mouth every morning. 90 tablet 1   Vitamin D , Cholecalciferol, 25 MCG (1000 UT) CAPS Take 1 capsule by mouth daily.     No current facility-administered medications for this visit.   No results found.  Review of Systems:   A ROS was performed including pertinent positives and negatives as documented in the HPI.  Physical Exam :   Constitutional: NAD and appears stated age Neurological: Alert and oriented Psych: Appropriate affect and cooperative There were no vitals taken for this visit.   Comprehensive Musculoskeletal Exam:    Exam of the right great toe demonstrates presence of mild swelling and ecchymosis around the IP joint and proximal aspect of the distal phalanx.  No visible injury to the nailbed.  Brisk capillary refill less than 2 seconds distally with intact sensation.  Flexor and extensor mechanisms to the toe are intact.  Imaging:   Xray (right great toe 3 views): No evidence of acute fracture or dislocation   I personally reviewed and interpreted the radiographs.   Assessment:   33 y.o. female who presents today with right great toe pain after dropping a full metal water bottle directly onto it yesterday.  On exam there is no visible damage to the nailbed.  Her exam appears consistent with a contusion as x-rays do not show any evidence of acute bony abnormality.  Discussed that we could utilize a postop shoe if needed for comfort however she works from home and is able to tolerate flip-flops well at this time.  Expect that symptoms will significantly improve within the next 7 days and recommend that she can return to activity as tolerated.  Plan :    - Return to clinic as needed     I personally saw and evaluated the  patient, and participated in the management and treatment plan.  Leonce Reveal, PA-C Orthopedics

## 2024-05-03 DIAGNOSIS — Z6836 Body mass index (BMI) 36.0-36.9, adult: Secondary | ICD-10-CM | POA: Diagnosis not present

## 2024-05-03 DIAGNOSIS — F419 Anxiety disorder, unspecified: Secondary | ICD-10-CM | POA: Diagnosis not present

## 2024-05-03 DIAGNOSIS — Z13228 Encounter for screening for other metabolic disorders: Secondary | ICD-10-CM | POA: Diagnosis not present

## 2024-05-03 DIAGNOSIS — R635 Abnormal weight gain: Secondary | ICD-10-CM | POA: Diagnosis not present

## 2024-05-04 ENCOUNTER — Other Ambulatory Visit (HOSPITAL_BASED_OUTPATIENT_CLINIC_OR_DEPARTMENT_OTHER): Payer: Self-pay

## 2024-05-04 MED ORDER — SERTRALINE HCL 50 MG PO TABS
50.0000 mg | ORAL_TABLET | Freq: Every day | ORAL | 0 refills | Status: AC
  Filled 2024-05-04: qty 90, 90d supply, fill #0

## 2024-05-05 ENCOUNTER — Other Ambulatory Visit (HOSPITAL_BASED_OUTPATIENT_CLINIC_OR_DEPARTMENT_OTHER): Payer: Self-pay

## 2024-05-05 DIAGNOSIS — Z13228 Encounter for screening for other metabolic disorders: Secondary | ICD-10-CM | POA: Diagnosis not present

## 2024-05-05 MED ORDER — FLUZONE 0.5 ML IM SUSY
0.5000 mL | PREFILLED_SYRINGE | Freq: Once | INTRAMUSCULAR | 0 refills | Status: AC
  Filled 2024-05-05: qty 0.5, 1d supply, fill #0

## 2024-05-06 ENCOUNTER — Other Ambulatory Visit (HOSPITAL_BASED_OUTPATIENT_CLINIC_OR_DEPARTMENT_OTHER): Payer: Self-pay

## 2024-05-06 MED ORDER — VITAMIN D 1.25 MG (50000 UT) PO CAPS
1.0000 | ORAL_CAPSULE | ORAL | 3 refills | Status: AC
  Filled 2024-05-06: qty 12, 84d supply, fill #0

## 2024-05-08 ENCOUNTER — Other Ambulatory Visit (HOSPITAL_BASED_OUTPATIENT_CLINIC_OR_DEPARTMENT_OTHER): Payer: Self-pay

## 2024-05-11 ENCOUNTER — Encounter: Payer: Self-pay | Admitting: Radiology

## 2024-05-31 DIAGNOSIS — Z6836 Body mass index (BMI) 36.0-36.9, adult: Secondary | ICD-10-CM | POA: Diagnosis not present

## 2024-05-31 DIAGNOSIS — R635 Abnormal weight gain: Secondary | ICD-10-CM | POA: Diagnosis not present

## 2024-05-31 DIAGNOSIS — F419 Anxiety disorder, unspecified: Secondary | ICD-10-CM | POA: Diagnosis not present

## 2024-06-28 DIAGNOSIS — Z6835 Body mass index (BMI) 35.0-35.9, adult: Secondary | ICD-10-CM | POA: Diagnosis not present

## 2024-06-28 DIAGNOSIS — R635 Abnormal weight gain: Secondary | ICD-10-CM | POA: Diagnosis not present
